# Patient Record
Sex: Male | Born: 1985 | Race: White | Hispanic: No | Marital: Single | Smoking: Current every day smoker
Health system: Southern US, Community
[De-identification: ages and names within clinical notes are randomized; demographics above are authoritative.]

## PROBLEM LIST (undated history)

## (undated) VITALS — BP 127/81 | HR 116 | Temp 98.4°F | Resp 16 | Ht 71.0 in | Wt 160.0 lb

## (undated) DIAGNOSIS — J45909 Unspecified asthma, uncomplicated: Secondary | ICD-10-CM

## (undated) DIAGNOSIS — F419 Anxiety disorder, unspecified: Secondary | ICD-10-CM

---

## 2016-06-23 ENCOUNTER — Inpatient Hospital Stay (HOSPITAL_COMMUNITY)
Admission: AD | Admit: 2016-06-23 | Discharge: 2016-07-02 | DRG: 885 | Disposition: A | Discharge status: Home / Self Care | Payer: No Typology Code available for payment source | Source: Intra-hospital | Attending: Emergency Medicine | Admitting: Emergency Medicine

## 2016-06-23 ENCOUNTER — Encounter (HOSPITAL_COMMUNITY): Payer: Self-pay | Admitting: *Deleted

## 2016-06-23 DIAGNOSIS — R109 Unspecified abdominal pain: Secondary | ICD-10-CM

## 2016-06-23 DIAGNOSIS — F333 Major depressive disorder, recurrent, severe with psychotic symptoms: Secondary | ICD-10-CM

## 2016-06-23 DIAGNOSIS — F17210 Nicotine dependence, cigarettes, uncomplicated: Secondary | ICD-10-CM | POA: Diagnosis present

## 2016-06-23 DIAGNOSIS — F323 Major depressive disorder, single episode, severe with psychotic features: Principal | ICD-10-CM | POA: Diagnosis present

## 2016-06-23 DIAGNOSIS — R4689 Other symptoms and signs involving appearance and behavior: Secondary | ICD-10-CM

## 2016-06-23 DIAGNOSIS — R45851 Suicidal ideations: Secondary | ICD-10-CM | POA: Diagnosis present

## 2016-06-23 DIAGNOSIS — N390 Urinary tract infection, site not specified: Secondary | ICD-10-CM

## 2016-06-23 DIAGNOSIS — R4589 Other symptoms and signs involving emotional state: Secondary | ICD-10-CM | POA: Diagnosis present

## 2016-06-23 HISTORY — DX: Unspecified asthma, uncomplicated: J45.909

## 2016-06-23 HISTORY — DX: Anxiety disorder, unspecified: F41.9

## 2016-06-23 MED ORDER — TRAZODONE HCL 50 MG PO TABS: 50.0000 mg | ORAL_TABLET | Freq: Every evening | ORAL | Status: DC | PRN

## 2016-06-23 MED ORDER — ALUM & MAG HYDROXIDE-SIMETH 200-200-20 MG/5ML PO SUSP: 30.0000 mL | ORAL | Status: DC | PRN

## 2016-06-23 MED ORDER — ACETAMINOPHEN 325 MG PO TABS
650.0000 mg | ORAL_TABLET | Freq: Four times a day (QID) | ORAL | Status: DC | PRN
  Administered 2016-06-26 – 2016-06-30 (×6): 650 mg via ORAL
  Filled 2016-06-23 (×6): qty 2

## 2016-06-23 MED ORDER — MAGNESIUM HYDROXIDE 400 MG/5ML PO SUSP: 30.0000 mL | Freq: Every day | ORAL | Status: DC | PRN

## 2016-06-23 MED ORDER — NICOTINE 21 MG/24HR TD PT24
21.0000 mg | MEDICATED_PATCH | Freq: Every day | TRANSDERMAL | Status: DC
  Administered 2016-06-24 – 2016-07-02 (×9): 21 mg via TRANSDERMAL
  Filled 2016-06-23 (×11): qty 1

## 2016-06-23 MED ORDER — HYDROXYZINE HCL 25 MG PO TABS
25.0000 mg | ORAL_TABLET | Freq: Four times a day (QID) | ORAL | Status: DC | PRN
  Administered 2016-06-24 – 2016-07-01 (×13): 25 mg via ORAL
  Filled 2016-06-23 (×13): qty 1
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
## 2016-06-24 DIAGNOSIS — F333 Major depressive disorder, recurrent, severe with psychotic symptoms: Secondary | ICD-10-CM

## 2016-06-24 DIAGNOSIS — F1920 Other psychoactive substance dependence, uncomplicated: Secondary | ICD-10-CM | POA: Diagnosis not present

## 2016-06-24 LAB — URINALYSIS W MICROSCOPIC (NOT AT ARMC)
BILIRUBIN URINE: NEGATIVE
GLUCOSE, UA: NEGATIVE mg/dL
Hgb urine dipstick: NEGATIVE
KETONES UR: NEGATIVE mg/dL
Nitrite: NEGATIVE
PH: 6 (ref 5.0–8.0)
PROTEIN: NEGATIVE mg/dL
Specific Gravity, Urine: 1.023 (ref 1.005–1.030)

## 2016-06-24 MED ORDER — MIRTAZAPINE 7.5 MG PO TABS
7.5000 mg | ORAL_TABLET | Freq: Every day | ORAL | Status: DC
  Administered 2016-06-24 – 2016-06-26 (×3): 7.5 mg via ORAL
  Filled 2016-06-24 (×6): qty 1

## 2016-06-24 MED ORDER — MOMETASONE FURO-FORMOTEROL FUM 100-5 MCG/ACT IN AERO
2.0000 | INHALATION_SPRAY | Freq: Two times a day (BID) | RESPIRATORY_TRACT | Status: DC
  Administered 2016-06-24 – 2016-07-01 (×15): 2 via RESPIRATORY_TRACT
  Filled 2016-06-24: qty 8.8

## 2016-06-24 MED ORDER — BACITRACIN-NEOMYCIN-POLYMYXIN OINTMENT TUBE
TOPICAL_OINTMENT | CUTANEOUS | Status: DC | PRN
  Filled 2016-06-24: qty 28
  Filled 2016-06-24 (×2): qty 15

## 2016-06-24 MED ORDER — FLUOXETINE HCL 20 MG PO CAPS
20.0000 mg | ORAL_CAPSULE | Freq: Every day | ORAL | Status: DC
  Administered 2016-06-24 – 2016-07-01 (×8): 20 mg via ORAL
  Filled 2016-06-24 (×12): qty 1

## 2016-06-24 MED ORDER — ALBUTEROL SULFATE HFA 108 (90 BASE) MCG/ACT IN AERS: 1.0000 | INHALATION_SPRAY | Freq: Four times a day (QID) | RESPIRATORY_TRACT | Status: DC | PRN
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
## 2016-06-24 NOTE — Plan of Care (Signed)
Problem: Self-Concept: Goal: Ability to disclose and discuss suicidal ideas will improve Outcome: Progressing Patient able to state he feels, "Depressed". Denies SI.
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
## 2016-06-24 NOTE — [facility] Group Notes (Signed)
[facility]   06/24/2016 9:53 AM  Participation Quality:  Appropriate/minimal   Mood/Affect:  Flat  Depression Rating:  0  Anxiety Rating:  0-pt appears to be minimizing this morning.   Thoughts of Suicide:  No Will you contract for safety?   NA  Current AVH:  No  Plan for Discharge/Comments:  Pt reports "things just got out of control. I have a lot of shit on my plate." Pt reports that he lives alone and quite drinking about a year ago. Pt reports some marijuana use and no other s/a although pt is positive for opiates upon admission. No current providers.   Transportation Means: family member possibly   Supports: some family/pt vague about family supports   Counselling psychologistmart, Conservation officer, natureHeather LCSW
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
## 2016-06-24 NOTE — B[facility] Suicide Risk Assessment (Signed)
B[facility] INPATIENT:  Family/Significant Other Suicide Prevention Education  Suicide Prevention Education:  Education Completed; Felipa FurnaceMichelle Vasquez (pt's girlfriend) [phone] has been identified by the patient as the family member/significant other with whom the patient will be residing, and identified as the person(s) who will aid the patient in the event of a mental health crisis (suicidal ideations/suicide attempt).  With written consent from the patient, the family member/significant other has been provided the following suicide prevention education, prior to the and/or following the discharge of the patient.  The suicide prevention education provided includes the following:  Suicide risk factors  Suicide prevention and interventions  National Suicide Hotline telephone number  [facility] assessment telephone number  [facility] Emergency Assistance 911  [facility] and/or Residential Mobile Crisis Unit telephone number  Request made of family/significant other to:  Remove weapons (e.g., guns, rifles, knives), all items previously/currently identified as safety concern.    Remove drugs/medications (over-the-counter, prescriptions, illicit drugs), all items previously/currently identified as a safety concern.  The family member/significant other verbalizes understanding of the suicide prevention education information provided.  The family member/significant other agrees to remove the items of safety concern listed above.  Smart, Jeshua Ransford LCSW 06/24/2016, 3:50 PM

## 2016-06-24 NOTE — Progress Notes (Signed)
Patient did attend the evening speaker AA meeting.  

## 2016-06-24 NOTE — Progress Notes (Signed)
Recreation Therapy Notes  Date: 07.14.2017 Time: 9:30am Location: 300 Hall Group Room   Group Topic: Stress Management  Goal Area(s) Addresses:  Patient will actively participate in stress management techniques presented during session.   Behavioral Response: Engaged, Attentive  Intervention: Stress management techniques  Activity : Deep Breathing and Progressive Body Relaxation. LRT provided education, instruction and demonstration on practice of Deep Breathing and Progressive Body Relaxation. Patient was asked to participate in technique introduced during session.   Education:  Stress Management, Discharge Planning.   Education Outcome: Acknowledges education  Clinical Observations/Feedback: Patient actively engaged in technique introduced, expressed no concerns and demonstrated ability to practice independently post d/c.   Marykay Lexenise L Darus Hershman, LRT/CTRS        Sapir Lavey L 06/24/2016 6:01 PM

## 2016-06-24 NOTE — [facility] Group Notes (Signed)
[facility] LCSW Group Therapy  06/24/2016 12:48 PM  Type of Therapy:  Group Therapy  Participation Level:  Active  Participation Quality:  Attentive  Affect:  Tearful  Cognitive:  Alert and Oriented   Insight:  Engaged  Engagement in Therapy:  Engaged  Modes of Intervention:  Confrontation, Discussion, Education, Exploration, Problem-solving, Rapport Building, Socialization and Support  Summary of Progress/Problems: Feelings around Relapse. Group members discussed the meaning of relapse and shared personal stories of relapse, how it affected them and others, and how they perceived themselves during this time. Group members were encouraged to identify triggers, warning signs and coping skills used when facing the possibility of relapse. Social supports were discussed and explored in detail. Post Acute Withdrawal Syndrome (handout provided) was introduced and examined. Pt's were encouraged to ask questions, talk about key points associated with PAWS, and process this information in terms of relapse prevention. Edward Stone was attentive and engaged during today's processing group. He shared his relapse story (regarding both MI and SA) that led to his involuntary admission. "I"m ready for help. I can't keep going on like this." He reports being sober one year and relapsing 3 days ago due to "lots of shit piling up." Edward Stone was tearful during group and stated that he had never opened up "like this" before.   Smart, Edward Ericsson LCSW 06/24/2016, 12:48 PM
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
## 2016-06-25 DIAGNOSIS — F4310 Post-traumatic stress disorder, unspecified: Secondary | ICD-10-CM

## 2016-06-25 DIAGNOSIS — F489 Nonpsychotic mental disorder, unspecified: Secondary | ICD-10-CM

## 2016-06-25 DIAGNOSIS — F332 Major depressive disorder, recurrent severe without psychotic features: Secondary | ICD-10-CM

## 2016-06-25 LAB — BASIC METABOLIC PANEL
ANION GAP: 5 (ref 5–15)
BUN: 9 mg/dL (ref 6–20)
CALCIUM: 9.4 mg/dL (ref 8.9–10.3)
CHLORIDE: 108 mmol/L (ref 101–111)
CO2: 29 mmol/L (ref 22–32)
Creatinine, Ser: 0.84 mg/dL (ref 0.61–1.24)
GFR calc non Af Amer: >60 mL/min (ref >60)
GLUCOSE: 98 mg/dL (ref 65–99)
POTASSIUM: 4.3 mmol/L (ref 3.5–5.1)
Sodium: 142 mmol/L (ref 135–145)

## 2016-06-25 LAB — CBC WITH DIFFERENTIAL/PLATELET
BASOS PCT: 0 %
Basophils Absolute: 0 10*3/uL (ref 0.0–0.1)
Eosinophils Absolute: 0.1 10*3/uL (ref 0.0–0.7)
Eosinophils Relative: 2 %
HEMATOCRIT: 43 % (ref 39.0–52.0)
HEMOGLOBIN: 14.3 g/dL (ref 13.0–17.0)
LYMPHS PCT: 38 %
Lymphs Abs: 3 10*3/uL (ref 0.7–4.0)
MCH: 31.6 pg (ref 26.0–34.0)
MCHC: 33.3 g/dL (ref 30.0–36.0)
MCV: 94.9 fL (ref 78.0–100.0)
MONO ABS: 0.7 10*3/uL (ref 0.1–1.0)
MONOS PCT: 9 %
NEUTROS ABS: 4 10*3/uL (ref 1.7–7.7)
NEUTROS PCT: 51 %
Platelets: 311 10*3/uL (ref 150–400)
RBC: 4.53 MIL/uL (ref 4.22–5.81)
RDW: 13.1 % (ref 11.5–15.5)
WBC: 7.9 10*3/uL (ref 4.0–10.5)

## 2016-06-25 LAB — TSH: TSH: 0.333 u[IU]/mL — ABNORMAL LOW (ref 0.350–4.500)

## 2016-06-25 NOTE — Progress Notes (Signed)
[facility] MD Progress Note  06/25/2016 10:13 AM Edward Stone  MRN:  [id number]   Subjective:  Patient reports " feeling better today than when I first came in but I am still very depressed."  Objective: Edward Stone is awake, alert and oriented X4.  Seen attending group session.  Denies suicidal or homicidal ideation. Denies auditory or visual hallucination and does not appear to be responding to internal stimuli. Patient interacts well with staff and others. Patient reports he is medication compliant without mediation side effects. States his depression 9/10. Patient states "I am feeling and little better." Reports good appetite and resting well. Support, encouragement and reassurance was provided.   Principal Problem: Suicidal behavior Diagnosis:   Patient Active Problem List   Diagnosis Date Noted  . Suicidal behavior [F48.9] 06/23/2016   Total Time spent with patient: 30 minutes  Past Psychiatric History: See Above  Past Medical History:  Past Medical History  Diagnosis Date  . Asthma   . Anxiety    History reviewed. No pertinent past surgical history. Family History: History reviewed. No pertinent family history. Family Psychiatric  History: See Above Social History:  History  Alcohol Use: Not on file     History  Drug Use  . Yes  . Special: Marijuana    Social History   Social History  . Marital Status: Single    Spouse Name: N/A  . Number of Children: N/A  . Years of Education: N/A   Social History Main Topics  . Smoking status: Current Every Day Smoker -- 1.00 packs/day    Types: Cigarettes  . Smokeless tobacco: Current User    Types: Chew  . Alcohol Use: None  . Drug Use: Yes    Special: Marijuana  . Sexual Activity: Not Currently   Other Topics Concern  . None   Social History Narrative  . None   Additional Social History:                         Sleep: Fair  Appetite:  Good  Current Medications: Current Facility-Administered Medications   Medication Dose Route Frequency Provider Last Rate Last Dose  . acetaminophen (TYLENOL) tablet 650 mg  650 mg Oral Q6H PRN Nanci Pina, FNP      . albuterol (PROVENTIL HFA;VENTOLIN HFA) 108 (90 Base) MCG/ACT inhaler 1 puff  1 puff Inhalation Q6H PRN Myer Peer Cobos, MD      . alum & mag hydroxide-simeth (MAALOX/MYLANTA) 200-200-20 MG/5ML suspension 30 mL  30 mL Oral Q4H PRN Nanci Pina, FNP      . FLUoxetine (PROZAC) capsule 20 mg  20 mg Oral Daily Jenne Campus, MD   20 mg at 06/25/16 0801  . hydrOXYzine (ATARAX/VISTARIL) tablet 25 mg  25 mg Oral Q6H PRN Nanci Pina, FNP   25 mg at 06/24/16 2242  . magnesium hydroxide (MILK OF MAGNESIA) suspension 30 mL  30 mL Oral Daily PRN Nanci Pina, FNP      . mirtazapine (REMERON) tablet 7.5 mg  7.5 mg Oral QHS Myer Peer Cobos, MD   7.5 mg at 06/24/16 2243  . mometasone-formoterol (DULERA) 100-5 MCG/ACT inhaler 2 puff  2 puff Inhalation BID Jenne Campus, MD   2 puff at 06/25/16 0802  . neomycin-bacitracin-polymyxin (NEOSPORIN) ointment   Topical PRN Encarnacion Slates, NP      . nicotine (NICODERM CQ - dosed in mg/24 hours) patch 21 mg  21  mg Transdermal Daily Nanci Pina, FNP   21 mg at 06/25/16 0938    Lab Results:  Results for orders placed or performed during the hospital encounter of 06/23/16 (from the past 48 hour(s))  Urinalysis with microscopic (not at [facility])     Status: Abnormal   Collection Time: 06/24/16  6:25 PM  Result Value Ref Range   Color, Urine YELLOW YELLOW   APPearance CLEAR CLEAR   Specific Gravity, Urine 1.023 1.005 - 1.030   pH 6.0 5.0 - 8.0   Glucose, UA NEGATIVE NEGATIVE mg/dL   Hgb urine dipstick NEGATIVE NEGATIVE   Bilirubin Urine NEGATIVE NEGATIVE   Ketones, ur NEGATIVE NEGATIVE mg/dL   Protein, ur NEGATIVE NEGATIVE mg/dL   Nitrite NEGATIVE NEGATIVE   Leukocytes, UA SMALL (A) NEGATIVE   WBC, UA 0-5 0 - 5 WBC/hpf   RBC / HPF 0-5 0 - 5 RBC/hpf   Bacteria, UA RARE (A) NONE SEEN   Squamous  Epithelial / LPF 0-5 (A) NONE SEEN   Crystals CA OXALATE CRYSTALS (A) NEGATIVE    Comment: Performed at [place] metabolic panel     Status: None   Collection Time: 06/25/16  6:20 AM  Result Value Ref Range   Sodium 142 135 - 145 mmol/L   Potassium 4.3 3.5 - 5.1 mmol/L   Chloride 108 101 - 111 mmol/L   CO2 29 22 - 32 mmol/L   Glucose, Bld 98 65 - 99 mg/dL   BUN 9 6 - 20 mg/dL   Creatinine, Ser 0.84 0.61 - 1.24 mg/dL   Calcium 9.4 8.9 - 10.3 mg/dL   GFR calc non Af Amer >60 >60 mL/min   GFR calc Af Amer >60 >60 mL/min    Comment: (NOTE) The eGFR has been calculated using the CKD EPI equation. This calculation has not been validated in all clinical situations. eGFR's persistently <60 mL/min signify possible Chronic Kidney Disease.    Anion gap 5 5 - 15    Comment: Performed at [facility]  CBC with Differential/Platelet     Status: None   Collection Time: 06/25/16  6:20 AM  Result Value Ref Range   WBC 7.9 4.0 - 10.5 K/uL   RBC 4.53 4.22 - 5.81 MIL/uL   Hemoglobin 14.3 13.0 - 17.0 g/dL   HCT 43.0 39.0 - 52.0 %   MCV 94.9 78.0 - 100.0 fL   MCH 31.6 26.0 - 34.0 pg   MCHC 33.3 30.0 - 36.0 g/dL   RDW 13.1 11.5 - 15.5 %   Platelets 311 150 - 400 K/uL   Neutrophils Relative % 51 %   Neutro Abs 4.0 1.7 - 7.7 K/uL   Lymphocytes Relative 38 %   Lymphs Abs 3.0 0.7 - 4.0 K/uL   Monocytes Relative 9 %   Monocytes Absolute 0.7 0.1 - 1.0 K/uL   Eosinophils Relative 2 %   Eosinophils Absolute 0.1 0.0 - 0.7 K/uL   Basophils Relative 0 %   Basophils Absolute 0.0 0.0 - 0.1 K/uL    Comment: Performed at [facility]  TSH     Status: Abnormal   Collection Time: 06/25/16  6:20 AM  Result Value Ref Range   TSH 0.333 (L) 0.350 - 4.500 uIU/mL    Comment: Performed at [facility]    Blood Alcohol level:  No results found for: [facility]  Metabolic Disorder Labs: No results found for: HGBA1C, MPG No results  found  for: PROLACTIN No results found for: CHOL, TRIG, HDL, CHOLHDL, VLDL, LDLCALC  Physical Findings: AIMS: Facial and Oral Movements Muscles of Facial Expression: None, normal Lips and Perioral Area: None, normal Jaw: None, normal Tongue: None, normal,Extremity Movements Upper (arms, wrists, hands, fingers): None, normal Lower (legs, knees, ankles, toes): None, normal, Trunk Movements Neck, shoulders, hips: None, normal, Overall Severity Severity of abnormal movements (highest score from questions above): None, normal Incapacitation due to abnormal movements: None, normal Patient's awareness of abnormal movements (rate only patient's report): No Awareness, Dental Status Current problems with teeth and/or dentures?: No Does patient usually wear dentures?: No  CIWA:    COWS:     Musculoskeletal: Strength & Muscle Tone: within normal limits Gait & Station: normal Patient leans: N/A  Psychiatric Specialty Exam: Physical Exam  Nursing note and vitals reviewed. Constitutional: He is oriented to person, place, and time. He appears well-developed.  HENT:  Head: Normocephalic.  Cardiovascular: Normal rate.   Musculoskeletal: Normal range of motion.  Neurological: He is alert and oriented to person, place, and time.  Psychiatric: He has a normal mood and affect. His behavior is normal.    Review of Systems  Psychiatric/Behavioral: Positive for depression. Negative for suicidal ideas, hallucinations and substance abuse. The patient is nervous/anxious.   All other systems reviewed and are negative.   Blood pressure 134/86, pulse 68, temperature 97.5 F (36.4 C), temperature source Oral, resp. rate 16, height 5' 7.72" (1.72 m), weight 60.782 kg (134 lb).Body mass index is 20.55 kg/(m^2).  General Appearance: Casual and Fairly Groomed  Eye Contact:  Good  Speech:  Clear and Coherent  Volume:  Normal  Mood:  Depressed and Irritable  Affect:  Flat  Thought Process:  Coherent   Orientation:  Full (Time, Place, and Person)  Thought Content:  Hallucinations: None  Suicidal Thoughts:  No  Homicidal Thoughts:  No  Memory:  Immediate;   Fair Recent;   Fair Remote;   Fair  Judgement:  Fair  Insight:  Good  Psychomotor Activity:  Restlessness  Concentration:  Concentration: Fair  Recall:  [employer] of Knowledge:  Fair  Language:  Fair  Akathisia:  No  Handed:  Right  AIMS (if indicated):     Assets:  Communication Skills Desire for Improvement Resilience Social Support  ADL's:  Intact  Cognition:  WNL  Sleep:  Number of Hours: 5.25    I agree with current treatment plan on 06/25/2016, Patient seen face-to-face for psychiatric evaluation follow-up, chart reviewed. Reviewed the information documented and agree with the treatment plan.  Treatment Plan Summary: Daily contact with patient to assess and evaluate symptoms and progress in treatment and Medication management   Medications: We discussed medication options, agrees to antidepressant, states price is a consideration as cannot afford medications easily We agreed to start [facility] for depression and augment with REMERON 7.5 mgrs QHS for insomnia, depression  Will consider adding an antipsychotic , but at this time prefers to start with the above two medications  Will continue to monitor vitals ,medication compliance and treatment side effects while patient is here.  Reviewed Labs: THS 0.333(L). Orders placed for free T-4  ,BAL -0, UDS - no collected CSW will start working on disposition.  Patient to participate in therapeutic milieu  Derrill Center, NP 06/25/2016, 10:13 AM Agree with NP Progress Note, as above

## 2016-06-25 NOTE — Progress Notes (Signed)
D) Pt. Reports beginning to feel better.  Superficially pleasant during interactions.  No c/o physical discomfort. A) Medication education provided.  Pt. Compliant.  Pt. Minimizing issues.   R) Pt. Receptive and contracts for safety.
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
## 2016-06-25 NOTE — [facility] Group Notes (Signed)
  [facility] LCSW Group Therapy Note  06/25/2016 at 10:00 AM  Type of Therapy and Topic:  Group Therapy: Avoiding Self-Sabotaging and Enabling Behaviors  Participation Level:  Active  Participation Quality:  Monopolizing  Affect:  Appropriate  Cognitive:  Alert and Oriented  Insight:  Developing/Improving  Engagement in Therapy:  Engaged   Therapeutic models used: Cognitive Behavioral Therapy,  Person-Centered Therapy and Motivational Interviewing  Modes of Intervention:  Discussion, Exploration, Orientation, Rapport Building, Socialization and Support   Summary of Progress/Problems:  The main focus of today's process group was for the patient to identify ways in which they have in the past sabotaged their own recovery. Motivational Interviewing was utilized to identify motivation they may have for wanting to change. Patient shared easily and frequently and at times had tendency to monopolize. Patient was easily redirected and willing to refocus to avoid tangents.    Carney Bernatherine C Denzal Meir, LCSW

## 2016-06-25 NOTE — Progress Notes (Signed)
D.  Pt pleasant on approach, denies complaints at this time.  Positive for evening AA group but left due to peer that "got on my nerves".  Pt observed interacting appropriately with peers on the unit.  Pt denies SI/HI/hallucinations at this time.  A.  Support and encouragement offered, medication given as ordered.  R.  Pt remains safe on the unit, will continue to monitor.

## 2016-06-25 NOTE — Progress Notes (Signed)
D: Pt complained moderate anxiety and moderate depression; states, "they are both still pretty high but I must tell you, they are better than they were when I came in." Pt also rate L. shoulder pain at 4. Pt however, denied SI, HI or AVH. Pt was observed having appropriate conversation with peers. Pt remained calm and cooperative. A: Medications offered as prescribed.  Support, encouragement, and safe environment provided.  15-minute safety checks continue. R: Pt was med compliant.  Pt attended group. Safety checks continue.

## 2016-06-26 MED ORDER — CLONIDINE HCL ER 0.1 MG PO TB12: 0.1000 mg | ORAL_TABLET | Freq: Three times a day (TID) | ORAL | Status: DC | PRN

## 2016-06-26 MED ORDER — LISINOPRIL 10 MG PO TABS
10.0000 mg | ORAL_TABLET | Freq: Every day | ORAL | Status: DC
  Administered 2016-06-26 – 2016-07-01 (×6): 10 mg via ORAL
  Filled 2016-06-26 (×7): qty 1
  Filled 2016-06-26: qty 2
  Filled 2016-06-26 (×2): qty 1

## 2016-06-26 MED ORDER — CLONIDINE HCL ER 0.1 MG PO TB12
0.1000 mg | ORAL_TABLET | Freq: Every day | ORAL | Status: DC
Start: 2016-06-26 — End: 2016-06-26

## 2016-06-26 MED ORDER — CLONIDINE HCL 0.1 MG PO TABS
0.1000 mg | ORAL_TABLET | Freq: Three times a day (TID) | ORAL | Status: DC | PRN
Start: 2016-06-26 — End: 2016-07-01

## 2016-06-26 NOTE — Progress Notes (Signed)
[facility] MD Progress Note  06/26/2016 3:43 PM Jameon Deller [facility]  MRN:  [id number]   Subjective:  Patient reports " I think I am Bipolar because I have a lot of family members that have that."    Objective: Eliyas Suddreth [facility] is awake, alert and oriented X4.  Seen attending group session.  Patient reports auditory hallucination of his deceased children. patient reports" I am unable to tell if I am reliving the moments with my kids. But I do hear their voices when I am alone". Patient denies command hallucinations. Denies suicidal or homicidal ideation. Denies a visual hallucination and does not appear to be responding to internal stimuli. Patient interacts well with staff and others. Patient reports he is medication compliant without mediation side effects. States his depression 9/10. Reports good appetite and resting well. Support, encouragement and reassurance was provided.   Patient reports past hx of thyroid surgery.   Principal Problem: Suicidal behavior Diagnosis:   Patient Active Problem List   Diagnosis Date Noted  . Suicidal behavior [F48.9] 06/23/2016   Total Time spent with patient: 30 minutes  Past Psychiatric History: See Above  Past Medical History:  Past Medical History  Diagnosis Date  . Asthma   . Anxiety    History reviewed. No pertinent past surgical history. Family History: History reviewed. No pertinent family history. Family Psychiatric  History: See Above Social History:  History  Alcohol Use: Not on file     History  Drug Use  . Yes  . Special: Marijuana    Social History   Social History  . Marital Status: Single    Spouse Name: N/A  . Number of Children: N/A  . Years of Education: N/A   Social History Main Topics  . Smoking status: Current Every Day Smoker -- 1.00 packs/day    Types: Cigarettes  . Smokeless tobacco: Current User    Types: Chew  . Alcohol Use: None  . Drug Use: Yes    Special: Marijuana  . Sexual Activity: Not Currently   Other Topics  Concern  . None   Social History Narrative  . None   Additional Social History:                         Sleep: Fair  Appetite:  Good  Current Medications: Current Facility-Administered Medications  Medication Dose Route Frequency Provider Last Rate Last Dose  . acetaminophen (TYLENOL) tablet 650 mg  650 mg Oral Q6H PRN Nanci Pina, FNP   650 mg at 06/26/16 1428  . albuterol (PROVENTIL HFA;VENTOLIN HFA) 108 (90 Base) MCG/ACT inhaler 1 puff  1 puff Inhalation Q6H PRN Myer Peer Aveon Colquhoun, MD      . alum & mag hydroxide-simeth (MAALOX/MYLANTA) 200-200-20 MG/5ML suspension 30 mL  30 mL Oral Q4H PRN Nanci Pina, FNP      . FLUoxetine (PROZAC) capsule 20 mg  20 mg Oral Daily Jenne Campus, MD   20 mg at 06/26/16 0749  . hydrOXYzine (ATARAX/VISTARIL) tablet 25 mg  25 mg Oral Q6H PRN Nanci Pina, FNP   25 mg at 06/26/16 1120  . lisinopril (PRINIVIL,ZESTRIL) tablet 10 mg  10 mg Oral Daily Derrill Center, NP   10 mg at 06/26/16 0840  . magnesium hydroxide (MILK OF MAGNESIA) suspension 30 mL  30 mL Oral Daily PRN Nanci Pina, FNP      . mirtazapine (REMERON) tablet 7.5 mg  7.5 mg Oral QHS Felicita Gage  A Marnita Poirier, MD   7.5 mg at 06/25/16 2220  . mometasone-formoterol (DULERA) 100-5 MCG/ACT inhaler 2 puff  2 puff Inhalation BID Jenne Campus, MD   2 puff at 06/26/16 0751  . neomycin-bacitracin-polymyxin (NEOSPORIN) ointment   Topical PRN Encarnacion Slates, NP      . nicotine (NICODERM CQ - dosed in mg/24 hours) patch 21 mg  21 mg Transdermal Daily Nanci Pina, FNP   21 mg at 06/26/16 7793    Lab Results:  Results for orders placed or performed during the hospital encounter of 06/23/16 (from the past 48 hour(s))  Urinalysis with microscopic (not at [facility])     Status: Abnormal   Collection Time: 06/24/16  6:25 PM  Result Value Ref Range   Color, Urine YELLOW YELLOW   APPearance CLEAR CLEAR   Specific Gravity, Urine 1.023 1.005 - 1.030   pH 6.0 5.0 - 8.0   Glucose, UA  NEGATIVE NEGATIVE mg/dL   Hgb urine dipstick NEGATIVE NEGATIVE   Bilirubin Urine NEGATIVE NEGATIVE   Ketones, ur NEGATIVE NEGATIVE mg/dL   Protein, ur NEGATIVE NEGATIVE mg/dL   Nitrite NEGATIVE NEGATIVE   Leukocytes, UA SMALL (A) NEGATIVE   WBC, UA 0-5 0 - 5 WBC/hpf   RBC / HPF 0-5 0 - 5 RBC/hpf   Bacteria, UA RARE (A) NONE SEEN   Squamous Epithelial / LPF 0-5 (A) NONE SEEN   Crystals CA OXALATE CRYSTALS (A) NEGATIVE    Comment: Performed at [facility] metabolic panel     Status: None   Collection Time: 06/25/16  6:20 AM  Result Value Ref Range   Sodium 142 135 - 145 mmol/L   Potassium 4.3 3.5 - 5.1 mmol/L   Chloride 108 101 - 111 mmol/L   CO2 29 22 - 32 mmol/L   Glucose, Bld 98 65 - 99 mg/dL   BUN 9 6 - 20 mg/dL   Creatinine, Ser 0.84 0.61 - 1.24 mg/dL   Calcium 9.4 8.9 - 10.3 mg/dL   GFR calc non Af Amer >60 >60 mL/min   GFR calc Af Amer >60 >60 mL/min    Comment: (NOTE) The eGFR has been calculated using the CKD EPI equation. This calculation has not been validated in all clinical situations. eGFR's persistently <60 mL/min signify possible Chronic Kidney Disease.    Anion gap 5 5 - 15    Comment: Performed at [facility]  CBC with Differential/Platelet     Status: None   Collection Time: 06/25/16  6:20 AM  Result Value Ref Range   WBC 7.9 4.0 - 10.5 K/uL   RBC 4.53 4.22 - 5.81 MIL/uL   Hemoglobin 14.3 13.0 - 17.0 g/dL   HCT 43.0 39.0 - 52.0 %   MCV 94.9 78.0 - 100.0 fL   MCH 31.6 26.0 - 34.0 pg   MCHC 33.3 30.0 - 36.0 g/dL   RDW 13.1 11.5 - 15.5 %   Platelets 311 150 - 400 K/uL   Neutrophils Relative % 51 %   Neutro Abs 4.0 1.7 - 7.7 K/uL   Lymphocytes Relative 38 %   Lymphs Abs 3.0 0.7 - 4.0 K/uL   Monocytes Relative 9 %   Monocytes Absolute 0.7 0.1 - 1.0 K/uL   Eosinophils Relative 2 %   Eosinophils Absolute 0.1 0.0 - 0.7 K/uL   Basophils Relative 0 %   Basophils Absolute 0.0 0.0 - 0.1 K/uL    Comment:  Performed at [facility]  TSH     Status: Abnormal   Collection Time: 06/25/16  6:20 AM  Result Value Ref Range   TSH 0.333 (L) 0.350 - 4.500 uIU/mL    Comment: Performed at [facility]    Blood Alcohol level:  No results found for: [facility]  Metabolic Disorder Labs: No results found for: HGBA1C, MPG No results found for: PROLACTIN No results found for: CHOL, TRIG, HDL, CHOLHDL, VLDL, LDLCALC  Physical Findings: AIMS: Facial and Oral Movements Muscles of Facial Expression: None, normal Lips and Perioral Area: None, normal Jaw: None, normal Tongue: None, normal,Extremity Movements Upper (arms, wrists, hands, fingers): None, normal Lower (legs, knees, ankles, toes): None, normal, Trunk Movements Neck, shoulders, hips: None, normal, Overall Severity Severity of abnormal movements (highest score from questions above): None, normal Incapacitation due to abnormal movements: None, normal Patient's awareness of abnormal movements (rate only patient's report): No Awareness, Dental Status Current problems with teeth and/or dentures?: No Does patient usually wear dentures?: No  CIWA:    COWS:     Musculoskeletal: Strength & Muscle Tone: within normal limits Gait & Station: normal Patient leans: N/A  Psychiatric Specialty Exam: Physical Exam  Nursing note and vitals reviewed. Constitutional: He is oriented to person, place, and time. He appears well-developed.  HENT:  Head: Normocephalic.  Cardiovascular: Normal rate.   Musculoskeletal: Normal range of motion.  Neurological: He is alert and oriented to person, place, and time.  Psychiatric: He has a normal mood and affect. His behavior is normal.    Review of Systems  Psychiatric/Behavioral: Positive for depression. Negative for suicidal ideas, hallucinations and substance abuse. The patient is nervous/anxious.   All other systems reviewed and are negative.   Blood pressure 128/80, pulse 68,  temperature 97.9 F (36.6 C), temperature source Oral, resp. rate 16, height 5' 7.72" (1.72 m), weight 60.782 kg (134 lb).Body mass index is 20.55 kg/(m^2).  General Appearance: Casual and Fairly Groomed  Eye Contact:  Good  Speech:  Clear and Coherent  Volume:  Normal  Mood:  Depressed and Irritable  Affect:  Flat  Thought Process:  Coherent  Orientation:  Full (Time, Place, and Person)  Thought Content:  Hallucinations: Auditory  Suicidal Thoughts:  No  Homicidal Thoughts:  No  Memory:  Immediate;   Fair Recent;   Fair Remote;   Fair  Judgement:  Fair  Insight:  Good  Psychomotor Activity:  Restlessness  Concentration:  Concentration: Fair  Recall:  [employer] of Knowledge:  Fair  Language:  Fair  Akathisia:  No  Handed:  Right  AIMS (if indicated):     Assets:  Communication Skills Desire for Improvement Resilience Social Support  ADL's:  Intact  Cognition:  WNL  Sleep:  Number of Hours: 5.25    I agree with current treatment plan on 06/26/2016, Patient seen face-to-face for psychiatric evaluation follow-up, chart reviewed. Reviewed the information documented and agree with the treatment plan.  Treatment Plan Summary: Daily contact with patient to assess and evaluate symptoms and progress in treatment and Medication management   Medications:  We discussed medication options, agrees to antidepressant, states price is a consideration as cannot afford medications easily We agreed to start [facility] for depression and augment with REMERON 7.5 mgrs QHS for insomnia, depression  Will consider adding an antipsychotic , but at this time prefers to start with the above two medications  Will continue to monitor vitals ,medication compliance and treatment side effects while patient is  here.  Start Lisinopril 10 mg PO BID for HTN Start clonidine 0.104m PO TID for elevated B/P Reviewed Labs: TSH 0.333(L). Orders placed for free T-4, prolactin, A1c and Lipid  ,BAL -0, UDS - no  collected. Consider starting on antipsychotic- pending lab results. Due to auditory hallucinations CSW will start working on disposition.  Patient to participate in therapeutic milieu  TDerrill Center NP 06/26/2016, 3:43 PM Agree with NP progress note as above

## 2016-06-26 NOTE — Progress Notes (Signed)
D: Patient is pleasant upon approach.  Concerned about his elevated blood pressure.  States, "I'm not getting the BP medication I was taking at home."  Patient rates his depression as an 8; hopelessness and anxiety as 7.  He continues to report poor sleep.  His goal today is to "get through today."  He denies any self harm thoughts or behaviors.  He denies HI/AVH.   A: Continue to monitor medication management and MD orders.  Safety checks continued every 15 minutes per protocol.  Offer support and encouragement as needed.  Lisinopril 10 mg ordered for elevated BP. R: Patient is receptive to staff; his behavior is appropriate.

## 2016-06-26 NOTE — [facility] Group Notes (Signed)
[facility] Group Notes:  (Nursing/MHT/Case Management/Adjunct)  Date:  06/26/2016  Time:  0900 am  Type of Therapy:  Psychoeducational Skills  Participation Level:  Did Not Attend  Patient invited; declined to attend.  Edward Stone, Edward Stone 06/26/2016, 10:03 AM

## 2016-06-26 NOTE — [facility] Group Notes (Signed)
[facility] LCSW Group Therapy  06/26/2016 10:10 to 11:05 AM  Type of Therapy:  Group Therapy  Participation Level:  Active  Participation Quality:  Attentive and Sharing  Affect:  Appropriate  Cognitive:  Appropriate  Insight:  Developing/Improving  Engagement in Therapy:  Engaged  Modes of Intervention:  Activity, Discussion, Exploration, Rapport Building, Socialization and Support  Summary of Progress/Problems: Topic for today was thoughts and feelings regarding discharge. We discussed fears of upcoming changes including judegements, expectations and stigma of mental health issues. We then discussed supports: what constitutes a supportive framework. As patients processed their anxiety about discharge and described healthy supports patient shared importance of shared experience. Patient was encouraging to others.  Patient chose a visual to represent decompensation as a grave and improvement as a path that is actually doable  Carney Bernatherine C Harrill, LCSW

## 2016-06-26 NOTE — Progress Notes (Signed)
Patient did attend the evening speaker AA meeting.  

## 2016-06-27 DIAGNOSIS — F333 Major depressive disorder, recurrent, severe with psychotic symptoms: Secondary | ICD-10-CM | POA: Diagnosis not present

## 2016-06-27 LAB — T4, FREE: Free T4: 0.76 ng/dL (ref 0.61–1.12)

## 2016-06-27 MED ORDER — TOPIRAMATE 25 MG PO TABS
25.0000 mg | ORAL_TABLET | Freq: Every day | ORAL | Status: DC
Start: 2016-06-27 — End: 2016-07-01
  Administered 2016-06-27 – 2016-07-01 (×5): 25 mg via ORAL
  Filled 2016-06-27 (×9): qty 1

## 2016-06-27 MED ORDER — TRAZODONE HCL 50 MG PO TABS
50.0000 mg | ORAL_TABLET | Freq: Every evening | ORAL | Status: DC | PRN
  Administered 2016-06-27 – 2016-06-28 (×2): 50 mg via ORAL
  Filled 2016-06-27 (×9): qty 1

## 2016-06-27 MED ORDER — GABAPENTIN 100 MG PO CAPS
100.0000 mg | ORAL_CAPSULE | Freq: Three times a day (TID) | ORAL | Status: DC
  Administered 2016-06-27 – 2016-06-28 (×4): 100 mg via ORAL
  Filled 2016-06-27 (×9): qty 1
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
## 2016-06-27 NOTE — [facility] Group Notes (Signed)
[facility] LCSW Group Therapy  06/27/2016 4:27 PM  Type of Therapy:  Group Therapy  Participation Level:  Active  Participation Quality:  Attentive  Affect:  Appropriate  Cognitive:  Alert and Oriented  Insight:  Improving  Engagement in Therapy:  Improving  Modes of Intervention:  Discussion, Education, Exploration, Limit-setting, Problem-solving, Rapport Building, Socialization and Support  Summary of Progress/Problems: Today's Topic: Overcoming Obstacles. Patients identified one short term goal and potential obstacles in reaching this goal. Patients processed barriers involved in overcoming these obstacles. Patients identified steps necessary for overcoming these obstacles and explored motivation (internal and external) for facing these difficulties head on. Edward Stone was attentive and engaged during today's processing groups. Edward Stone shared that his biggest goal is to "work on my mood and my ptsd. I think I'm bipolar." He shared that he broke off his relationship with his fiance and is struggling with this loss but feels safe in the hospital setting.   Smart, Adi Seales LCSW 06/27/2016, 4:27 PM

## 2016-06-27 NOTE — Progress Notes (Signed)
Recreation Therapy Notes  Date: 07.17.2017 Time: 9:30am Location: [address] Group Room   Group Topic: Stress Management  Goal Area(s) Addresses:  Patient will actively participate in stress management techniques presented during session.   Behavioral Response: Engaged, Attentive   Intervention: Stress management techniques  Activity :  Deep Breathing, Mindfulness and Mindful Breathing. LRT provided education, instruction and demonstration on practice of Deep Breathing, Mindfulness and Mindful Breathing. Patient was asked to participate in technique introduced during session.   Education:  Stress Management, Discharge Planning.   Education Outcome: Acknowledges education  Clinical Observations/Feedback: Patient actively engaged in technique introduced, expressed no concerns and demonstrated ability to practice independently post d/c. Patient requested literature on stress management techniques covered during admission, LRT provided.   Marykay Lexenise L Lorrayne Ismael, LRT/CTRS        Willard Madrigal L 06/27/2016 1:31 PM

## 2016-06-27 NOTE — Progress Notes (Signed)
Pt stated he was concerned that he was supposed to get something (neurotin) for pain. Pt was encouraged to speak with the Doctor in the morning. Pt stated he would and had no more issues at this time.

## 2016-06-27 NOTE — [facility] Group Notes (Signed)
[facility]   06/27/2016 1:26 PM  Participation Quality: Invited. DID NOT ATTEND. Pt chose to rest in bed.    Smart, Jayven Naill LCSW
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
## 2016-06-28 ENCOUNTER — Encounter (HOSPITAL_COMMUNITY): Payer: Self-pay | Admitting: Mental Health

## 2016-06-28 DIAGNOSIS — F333 Major depressive disorder, recurrent, severe with psychotic symptoms: Secondary | ICD-10-CM | POA: Diagnosis not present

## 2016-06-28 DIAGNOSIS — F489 Nonpsychotic mental disorder, unspecified: Secondary | ICD-10-CM | POA: Diagnosis not present

## 2016-06-28 LAB — HEMOGLOBIN A1C
HEMOGLOBIN A1C: 5.5 % (ref 4.8–5.6)
MEAN PLASMA GLUCOSE: 111 mg/dL

## 2016-06-28 LAB — LIPID PANEL
Cholesterol: 140 mg/dL (ref 0–200)
HDL: 65 mg/dL (ref >40)
LDL CALC: 60 mg/dL (ref 0–99)
Total CHOL/HDL Ratio: 2.2 RATIO
Triglycerides: 73 mg/dL (ref <150)
VLDL: 15 mg/dL (ref 0–40)

## 2016-06-28 MED ORDER — ARIPIPRAZOLE 2 MG PO TABS
2.0000 mg | ORAL_TABLET | Freq: Every day | ORAL | Status: DC
  Administered 2016-06-28 – 2016-06-30 (×3): 2 mg via ORAL
  Filled 2016-06-28 (×7): qty 1

## 2016-06-28 MED ORDER — PRAZOSIN HCL 1 MG PO CAPS
1.0000 mg | ORAL_CAPSULE | Freq: Every day | ORAL | Status: DC
  Administered 2016-06-28 – 2016-06-30 (×3): 1 mg via ORAL
  Filled 2016-06-28 (×7): qty 1

## 2016-06-28 MED ORDER — GABAPENTIN 100 MG PO CAPS
200.0000 mg | ORAL_CAPSULE | Freq: Three times a day (TID) | ORAL | Status: DC
  Administered 2016-06-29 – 2016-06-30 (×5): 200 mg via ORAL
  Filled 2016-06-28 (×14): qty 2

## 2016-06-28 NOTE — Progress Notes (Signed)
Patient rated his day a 5. He stated that he had lots of anxiety. Goal is to maintain stability.

## 2016-06-28 NOTE — [facility] Group Notes (Signed)
[facility] LCSW Group Therapy  06/28/2016 1:54 PM  Type of Therapy:  Group Therapy  Participation Level:  Active  Participation Quality:  Attentive  Affect:  Appropriate  Cognitive:  Alert and Oriented  Insight:  Improving  Engagement in Therapy:  Engaged  Modes of Intervention:  Discussion, Education, Exploration, Problem-solving, Rapport Building, Socialization and Support  Summary of Progress/Problems: [facility] Speaker came to talk about his personal journey with substance abuse and addiction. The pt processed ways by which to relate to the speaker. [facility] speaker provided handouts and educational information pertaining to groups and services [facility].   Smart, Kla Bily LCSW 06/28/2016, 1:54 PM

## 2016-06-28 NOTE — Progress Notes (Signed)
D: Patient complains of anxiety today.  He continues to report depressive symptoms.  Patient is sleeping and eating well.  He rates his depression as a 7; hopelessness as a 4; anxiety as a 6.  He denies any withdrawal symptoms.  Patient's goal today is "getting well."  He denies any thoughts of self harm or behaviors.  He denies HI/AVH.  He has been attending groups and participating in his treatment.  He is observed in the day room interacting with his peers. A: Continue to monitor medication management and MD orders.  Safety checks completed every 15 minutes per protocol.  Offer support and encouragement as needed. R: Patient is receptive to staff; his behavior is appropriate.

## 2016-06-28 NOTE — Progress Notes (Signed)
Recreation Therapy Notes  Animal-Assisted Activity (AAA) Program Checklist/Progress Notes Patient Eligibility Criteria Checklist & Daily Group note for Rec TxIntervention  Date: 07.18.2017 Time: 2:45pm [address]    AAA/T Program Assumption of Risk Form signed by Patient/ or Parent Legal Guardian Yes  Patient is free of allergies or sever asthma Yes  Patient reports no fear of animals Yes  Patient reports no history of cruelty to animals Yes  Patient understands his/her participation is voluntary Yes  Behavioral Response: Did not attend.   Marykay Lexenise L Sharief Wainwright, LRT/CTRS         Larysa Pall L 06/28/2016 3:08 PM
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
## 2016-06-28 NOTE — [facility] Group Notes (Signed)
The focus of this group is to educate the patient on the purpose and policies of crisis stabilization and provide a format to answer questions about their admission.  The group details unit policies and expectations of patients while admitted.  Patient did not attend 0900 nurse education orientation group this morning.   Patient was sleeping.   
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
## 2016-06-29 ENCOUNTER — Emergency Department (HOSPITAL_COMMUNITY)
Admission: EM | Admit: 2016-06-29 | Discharge: 2016-06-29 | Disposition: A | Discharge status: Home / Self Care | Payer: Self-pay

## 2016-06-29 ENCOUNTER — Encounter (HOSPITAL_COMMUNITY): Payer: Self-pay

## 2016-06-29 ENCOUNTER — Inpatient Hospital Stay (HOSPITAL_COMMUNITY): Payer: No Typology Code available for payment source

## 2016-06-29 DIAGNOSIS — F333 Major depressive disorder, recurrent, severe with psychotic symptoms: Secondary | ICD-10-CM | POA: Diagnosis not present

## 2016-06-29 LAB — PROLACTIN: Prolactin: 16.6 ng/mL — ABNORMAL HIGH (ref 4.0–15.2)

## 2016-06-29 MED ORDER — SODIUM CHLORIDE 0.9 % IV SOLN
INTRAVENOUS | Status: DC
  Administered 2016-06-29: 1000 mL via INTRAVENOUS
  Filled 2016-06-29: qty 1000

## 2016-06-29 MED ORDER — KETOROLAC TROMETHAMINE 30 MG/ML IJ SOLN
30.0000 mg | Freq: Once | INTRAMUSCULAR | Status: AC
  Administered 2016-06-29: 30 mg via INTRAVENOUS
  Filled 2016-06-29: qty 1

## 2016-06-29 MED ORDER — LORATADINE 10 MG PO TABS
10.0000 mg | ORAL_TABLET | Freq: Every day | ORAL | Status: DC
  Administered 2016-06-29 – 2016-07-01 (×3): 10 mg via ORAL
  Filled 2016-06-29 (×6): qty 1

## 2016-06-29 MED ORDER — HYDROMORPHONE HCL 1 MG/ML IJ SOLN
1.0000 mg | Freq: Once | INTRAMUSCULAR | Status: AC
  Administered 2016-06-29: 1 mg via INTRAVENOUS
  Filled 2016-06-29: qty 1

## 2016-06-29 MED ORDER — TRAZODONE HCL 100 MG PO TABS
100.0000 mg | ORAL_TABLET | Freq: Every day | ORAL | Status: DC
  Administered 2016-06-30 (×2): 100 mg via ORAL
  Filled 2016-06-29 (×4): qty 1

## 2016-06-29 MED ORDER — LORATADINE 10 MG PO TABS
ORAL_TABLET | ORAL | Status: AC
  Administered 2016-06-29: 10 mg
  Filled 2016-06-29: qty 1

## 2016-06-29 MED ORDER — KETOROLAC TROMETHAMINE 30 MG/ML IJ SOLN
30.0000 mg | Freq: Once | INTRAMUSCULAR | Status: AC
  Administered 2016-06-29: 30 mg via INTRAMUSCULAR
  Filled 2016-06-29 (×2): qty 1
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
## 2016-06-29 NOTE — ED Notes (Signed)
Bed: WTR8 Expected date:  Expected time:  Means of arrival:  [facility]
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
## 2016-06-29 NOTE — Progress Notes (Signed)
[facility] per pellam with sitter, [facility]

## 2016-06-29 NOTE — [facility] Group Notes (Signed)
[facility] LCSW Group Therapy  06/29/2016 3:55 PM  Type of Therapy:  Group Therapy  Participation Level:  Active  Participation Quality:  Drowsy  Affect:  Depressed  Cognitive:  Oriented  Insight:  Improving  Engagement in Therapy:  Improving  Modes of Intervention:  Discussion, Education, Exploration, Problem-solving, Rapport Building, Socialization and Support  Summary of Progress/Problems: Today's Topic: Overcoming Obstacles. Patients identified one short term goal and potential obstacles in reaching this goal. Patients processed barriers involved in overcoming these obstacles. Patients identified steps necessary for overcoming these obstacles and explored motivation (internal and external) for facing these difficulties head on. Edward Stone was attentive and engaged during today's processing group. He stated that his biggest obstacle is "getting my anxiety and depression under control. I still think I'm bipolar and need to be medicated for that." Edward Muttersoy shared that he wants to meet with the doctor to gain clarity about his diagnosis and treatment plan.   Smart, Ian Castagna LCSW 06/29/2016, 3:55 PM

## 2016-06-29 NOTE — Progress Notes (Addendum)
Patient ID: Edward Stone, male   DOB: Dec 30, 1985, 30 y.o.   MRN: [id number] [facility] MD Progress Note  06/29/2016 1:59 PM Edward Stone  MRN:  [id number]   Subjective:  Patient reports ongoing anxiety, and episodes of increased anxiety, panic, although states that he can sometimes curtail symptoms by deep breathing and " trying to relax".   Denies medication side effects. Feels medications are helping partially.  Objective:  I have discussed case with treatment team and have met with patient . Reports he has been feeling partially better, and states nightmares have subsided on Minipress, but continues to feel depressed, and to have anxiety symptoms as above. Reports social anxiety symptoms, such as feeling self conscious in groups, and that " all eyes are on me ".  Denies any cravings. States he has had some fleeting passive SI, but states " it's getting better, I really don't want to die " Visible on the unit, going to some groups .    Principal Problem: Severe recurrent major depressive disorder with psychotic features ([address]) Diagnosis:   Patient Active Problem List   Diagnosis Date Noted  . Severe recurrent major depressive disorder with psychotic features ([place]) [F33.3] 06/28/2016  . Suicidal behavior [F48.9] 06/23/2016   Total Time spent with patient: 25 minutes   Past Psychiatric History: See Above  Past Medical History:  Past Medical History  Diagnosis Date  . Asthma   . Anxiety    History reviewed. No pertinent past surgical history. Family History: History reviewed. No pertinent family history. Family Psychiatric  History: See Above Social History:  History  Alcohol Use  . Yes     History  Drug Use  . Yes  . Special: Marijuana    Social History   Social History  . Marital Status: Single    Spouse Name: N/A  . Number of Children: N/A  . Years of Education: N/A   Social History Main Topics  . Smoking status: Current Every Day Smoker -- 1.00 packs/day    Types:  Cigarettes  . Smokeless tobacco: Current User    Types: Chew  . Alcohol Use: Yes  . Drug Use: Yes    Special: Marijuana  . Sexual Activity: Not Currently   Other Topics Concern  . None   Social History Narrative   Additional Social History:   Sleep: Fair  Appetite:  Good  Current Medications: Current Facility-Administered Medications  Medication Dose Route Frequency Provider Last Rate Last Dose  . loratadine (CLARITIN) 10 MG tablet           . acetaminophen (TYLENOL) tablet 650 mg  650 mg Oral Q6H PRN Nanci Pina, FNP   650 mg at 06/29/16 1884  . albuterol (PROVENTIL HFA;VENTOLIN HFA) 108 (90 Base) MCG/ACT inhaler 1 puff  1 puff Inhalation Q6H PRN Myer Peer Cobos, MD      . alum & mag hydroxide-simeth (MAALOX/MYLANTA) 200-200-20 MG/5ML suspension 30 mL  30 mL Oral Q4H PRN Nanci Pina, FNP      . ARIPiprazole (ABILIFY) tablet 2 mg  2 mg Oral QHS Encarnacion Slates, NP   2 mg at 06/28/16 2040  . cloNIDine (CATAPRES) tablet 0.1 mg  0.1 mg Oral TID PRN Derrill Center, NP      . FLUoxetine (PROZAC) capsule 20 mg  20 mg Oral Daily Jenne Campus, MD   20 mg at 06/29/16 0813  . gabapentin (NEURONTIN) capsule 200 mg  200 mg Oral TID Myer Peer Cobos,  MD   200 mg at 06/29/16 1155  . hydrOXYzine (ATARAX/VISTARIL) tablet 25 mg  25 mg Oral Q6H PRN Nanci Pina, FNP   25 mg at 06/29/16 1353  . lisinopril (PRINIVIL,ZESTRIL) tablet 10 mg  10 mg Oral Daily Derrill Center, NP   10 mg at 06/29/16 0813  . loratadine (CLARITIN) tablet 10 mg  10 mg Oral Daily Kerrie Buffalo, NP   10 mg at 06/29/16 1313  . magnesium hydroxide (MILK OF MAGNESIA) suspension 30 mL  30 mL Oral Daily PRN Nanci Pina, FNP      . mometasone-formoterol (DULERA) 100-5 MCG/ACT inhaler 2 puff  2 puff Inhalation BID Jenne Campus, MD   2 puff at 06/29/16 0815  . neomycin-bacitracin-polymyxin (NEOSPORIN) ointment   Topical PRN Encarnacion Slates, NP      . nicotine (NICODERM CQ - dosed in mg/24 hours) patch 21 mg  21 mg  Transdermal Daily Nanci Pina, FNP   21 mg at 06/29/16 0815  . prazosin (MINIPRESS) capsule 1 mg  1 mg Oral QHS Encarnacion Slates, NP   1 mg at 06/28/16 2040  . topiramate (TOPAMAX) tablet 25 mg  25 mg Oral Daily Jenne Campus, MD   25 mg at 06/29/16 0813  . traZODone (DESYREL) tablet 50 mg  50 mg Oral QHS,MR X 1 Laverle Hobby, PA-C   50 mg at 06/28/16 2228    Lab Results:  Results for orders placed or performed during the hospital encounter of 06/23/16 (from the past 48 hour(s))  Lipid panel     Status: None   Collection Time: 06/28/16  6:01 AM  Result Value Ref Range   Cholesterol 140 0 - 200 mg/dL   Triglycerides 73 <150 mg/dL   HDL 65 >40 mg/dL   Total CHOL/HDL Ratio 2.2 RATIO   VLDL 15 0 - 40 mg/dL   LDL Cholesterol 60 0 - 99 mg/dL    Comment:        Total Cholesterol/HDL:CHD Risk Coronary Heart Disease Risk Table                     Men   Women  1/2 Average Risk   3.4   3.3  Average Risk       5.0   4.4  2 X Average Risk   9.6   7.1  3 X Average Risk  23.4   11.0        Use the calculated Patient Ratio above and the CHD Risk Table to determine the patient's CHD Risk.        ATP III CLASSIFICATION (LDL):  <100     mg/dL   Optimal  100-129  mg/dL   Near or Above                    Optimal  130-159  mg/dL   Borderline  160-189  mg/dL   High  >190     mg/dL   Very High Performed at [facility]   Prolactin     Status: Abnormal   Collection Time: 06/28/16  6:01 AM  Result Value Ref Range   Prolactin 16.6 (H) 4.0 - 15.2 ng/mL    Comment: (NOTE) Performed At: [facility] [place], [place] [id number] Lindon Romp MD YD:[id number] Performed at [facility]     Blood Alcohol level:  No results found for: [facility]  Metabolic Disorder Labs: Lab  Results  Component Value Date   HGBA1C 5.5 06/27/2016   MPG 111 06/27/2016   Lab Results  Component Value Date   PROLACTIN 16.6* 06/28/2016   Lab Results  Component  Value Date   CHOL 140 06/28/2016   TRIG 73 06/28/2016   HDL 65 06/28/2016   CHOLHDL 2.2 06/28/2016   VLDL 15 06/28/2016   LDLCALC 60 06/28/2016    Physical Findings: AIMS: Facial and Oral Movements Muscles of Facial Expression: None, normal Lips and Perioral Area: None, normal Jaw: None, normal Tongue: None, normal,Extremity Movements Upper (arms, wrists, hands, fingers): None, normal Lower (legs, knees, ankles, toes): None, normal, Trunk Movements Neck, shoulders, hips: None, normal, Overall Severity Severity of abnormal movements (highest score from questions above): None, normal Incapacitation due to abnormal movements: None, normal Patient's awareness of abnormal movements (rate only patient's report): No Awareness, Dental Status Current problems with teeth and/or dentures?: No Does patient usually wear dentures?: No  CIWA:    COWS:     Musculoskeletal: Strength & Muscle Tone: within normal limits Gait & Station: normal Patient leans: N/A  Psychiatric Specialty Exam: Physical Exam  Nursing note and vitals reviewed. Constitutional: He is oriented to person, place, and time. He appears well-developed.  HENT:  Head: Normocephalic.  Cardiovascular: Normal rate.   Musculoskeletal: Normal range of motion.  Neurological: He is alert and oriented to person, place, and time.  Psychiatric: He has a normal mood and affect. His behavior is normal.    Review of Systems  Psychiatric/Behavioral: Positive for depression. Negative for suicidal ideas, hallucinations and substance abuse. The patient is nervous/anxious.   All other systems reviewed and are negative.   Blood pressure 107/72, pulse 99, temperature 97.6 F (36.4 C), temperature source Oral, resp. rate 16, height 5' 7.72" (1.72 m), weight 134 lb (60.782 kg).Body mass index is 20.55 kg/(m^2).  General Appearance: improved grooming   Eye Contact:  Good  Speech:  Clear and Coherent  Volume:  Normal  Mood: improved , but  still depressed, anxious   Affect:  Anxious , mildly constricted   Thought Process:  Linear  Orientation:  Full (Time, Place, and Person)  Thought Content: at this time no hallucinations, no delusions, not internally preoccupied   Suicidal Thoughts:  No denies suicidal ideations, denies any self injurious ideations - contracts for safety on the unit   Homicidal Thoughts:  No denies any homicidal or violent ideations   Memory:  Recent and remote grossly intact   Judgement:  Improving   Insight:  Improving   Psychomotor Activity:  Normal  Concentration:  Concentration: Good and Attention Span: Good  Recall:  Good  Fund of Knowledge:  Good  Language:  Good  Akathisia:  No  Handed:  Right  AIMS (if indicated):     Assets:  Communication Skills Desire for Improvement Resilience Social Support  ADL's:  Intact  Cognition:  WNL  Sleep:  Number of Hours: 6.25   Assessment -  Patient reports ongoing symptoms of depression, anxiety, some social anxiety symptoms, but overall has improved compared to admission, and at this time denies suicidal ideations. Currently tolerating medications well Less nightmares on Minipress, but sleep remains fair .  Treatment Plan Summary: Encourage group and milieu participation to work on coping skills and symptom reduction Continue PROZAC 20 mgrs Q DAY  for depression, anxiety   Continue  Abilify 2 mg for mood instability/adjunct treatment for depression.  Continue Minipress 1 mg QHS PTSD symptoms. Increase  Trazodone to  100  mgrs QHS for insomnia Increase  NEURONTIN 200 mgrs TID for anxiety and chronic pain, side effects reviewed  Continue  TOPAMAX 25 mgrs QDAY for migraine, headache prophylaxis, may also help mood , side effects discussed  Treatment team  working on disposition planning options . Marland Kitchen  Neita Garnet, MD 06/29/2016, 1:59 PM

## 2016-06-29 NOTE — Progress Notes (Signed)
Patient ID: Andris BaumannRoy J Macapagal, male   DOB: [birth year], 30 y.o.   MRN: [id number]  Pt currently presents with an animated affect and anxious, restless behavior. Per self inventory, pt rates depression at a 9, hopelessness 8 and anxiety 6. Pt's daily goal is to "what diagnosis is and to get well" and they intend to do so by "talk with dr." Pt reports poor sleep, a poor appetite, low energy and poor concentration. Pt complains of "migraines", identifies pressure above his eyes and in his anterior forehead as the source of current pain.   Pt provided with medications per providers orders. Pt's labs and vitals were monitored throughout the day. Pt supported emotionally and encouraged to express concerns and questions. Pt educated on medications. Provider notified of pt medical concerns.   Pt's safety ensured with 15 minute and environmental checks. Pt currently denies SI/HI and A/V hallucinations. Pt verbally agrees to seek staff if SI/HI or A/VH occurs and to consult with staff before acting on any harmful thoughts. Will continue POC.

## 2016-06-29 NOTE — Progress Notes (Signed)
Recreation Therapy Notes  Date: 07.19.2017 Time: 9:30am Location: [address] Group Room   Group Topic: Stress Management  Goal Area(s) Addresses:  Patient will actively participate in stress management techniques presented during session.   Behavioral Response: Engaged, Attentive   Intervention: Stress management techniques  Activity :  Deep Breathing and Guided Imagery. LRT provided education, instruction and demonstration on practice of  Deep Breathing and Guided Imagery. Patient was asked to participate in technique introduced during session.   Education:  Stress Management, Discharge Planning.   Education Outcome: Acknowledges education  Clinical Observations/Feedback: Patient actively engaged in technique introduced, expressed no concerns and demonstrated ability to practice independently post d/c.   Marykay Lexenise L Macall Mccroskey, LRT/CTRS        Rhianon Zabawa L 06/29/2016 3:24 PM

## 2016-06-29 NOTE — Significant Event (Signed)
Patient is a 30 y/o WM presenting to nursing staff with c/o of right flank pain/ RLQ abd pain, over the last hour. Patient is denying any nausea, vomiting or change in bowel habits. Mr. Edward Stone does endorse a subtle change in his urine quality, urinating an amber colored urine twice today. He also gives a remote hx of Kidney stones and abuses alcohol. On exam he has a positive Rt flank pain and Psoas Sign on exam. [facility]. Toradol 30 mg IM x one given  Agree with NP progress note as above

## 2016-06-29 NOTE — ED Notes (Signed)
[facility], he complains of flank pain and they want him evaluated for appendicitis and/or kidney stones

## 2016-06-29 NOTE — ED Provider Notes (Addendum)
CSN: [id number]     Arrival date & time 06/29/16  2145 History   First MD Initiated Contact with Patient 06/29/16 2204     Chief Complaint  Patient presents with  . Referral  . Flank Pain     (Consider location/radiation/quality/duration/timing/severity/associated sxs/prior Treatment) Patient is a 30 y.o. male presenting with flank pain. The history is provided by the patient.  Flank Pain Pertinent negatives include no chest pain, no abdominal pain, no headaches and no shortness of breath.  Patient c/o acute onset right flank pain tonight, at rest. [facility] he is being treated for depression, anxiety, and PTSD.  Patient indicates acute onset severe right flank pain. No radiation. Denies back or flank injury or strain. No fever or chills. No dysuria or hematuria. No scrotal or testicular pain. Patient indicates remote hx kidney stone, unsure if same.       Past Medical History  Diagnosis Date  . Asthma   . Anxiety    History reviewed. No pertinent past surgical history. History reviewed. No pertinent family history. Social History  Substance Use Topics  . Smoking status: Current Every Day Smoker -- 1.00 packs/day    Types: Cigarettes  . Smokeless tobacco: Current User    Types: Chew  . Alcohol Use: Yes    Review of Systems  Constitutional: Negative for fever.  HENT: Negative for sore throat.   Eyes: Negative for redness.  Respiratory: Negative for cough and shortness of breath.   Cardiovascular: Negative for chest pain.  Gastrointestinal: Negative for nausea, vomiting, abdominal pain and diarrhea.  Genitourinary: Positive for flank pain. Negative for dysuria and hematuria.  Musculoskeletal: Negative for neck pain.  Skin: Negative for rash.  Neurological: Negative for headaches.  Hematological: Does not bruise/bleed easily.  Psychiatric/Behavioral: Negative for confusion.      Allergies  Review of patient's allergies indicates no known  allergies.  Home Medications   Prior to Admission medications   Not on File   BP 116/79 mmHg  Pulse 76  Temp(Src) 98.1 F (36.7 C) (Oral)  Resp 18  Ht 5' 7.72" (1.72 m)  Wt 60.782 kg  BMI 20.55 kg/m2  SpO2 99% Physical Exam  Constitutional: He appears well-developed and well-nourished. No distress.  HENT:  Mouth/Throat: Oropharynx is clear and moist.  Eyes: Conjunctivae are normal. No scleral icterus.  Neck: Neck supple. No tracheal deviation present.  Cardiovascular: Normal rate, regular rhythm, normal heart sounds and intact distal pulses.   Pulmonary/Chest: Effort normal and breath sounds normal. No accessory muscle usage. No respiratory distress.  Abdominal: Soft. Bowel sounds are normal. He exhibits no distension and no mass. There is no tenderness. There is no rebound and no guarding.  Genitourinary:  No scrotal or testicular pain or tenderness. No cva tenderness.   Musculoskeletal: He exhibits no edema.  Neurological: He is alert.  Skin: Skin is warm and dry. He is not diaphoretic.  No rash/shingles to area of pain.   Psychiatric: He has a normal mood and affect.  Nursing note and vitals reviewed.   ED Course  Procedures (including critical care time) Labs Review  Ct Renal Stone Study  06/30/2016  CLINICAL DATA:  Right flank pain EXAM: CT ABDOMEN AND PELVIS WITHOUT CONTRAST TECHNIQUE: Multidetector CT imaging of the abdomen and pelvis was performed following the standard protocol without IV contrast. COMPARISON:  06/03/2014 FINDINGS: Lower chest and abdominal wall:  No contributory findings. Hepatobiliary: No focal liver abnormality.No evidence of biliary obstruction or  stone. Pancreas: Unremarkable. Spleen: Unremarkable. Adrenals/Urinary Tract: Negative adrenals. Two neighboring calcifications in the interpolar left kidney measuring 4 mm each on coronal reformats. No hydronephrosis or ureteral calculus. Presumed 12 mm left lower pole cyst. Unremarkable bladder.  Stomach/Bowel:  No obstruction. No appendicitis. Reproductive:No pathologic findings. Vascular/Lymphatic: No acute vascular abnormality. No mass or adenopathy. Other: No ascites or pneumoperitoneum. Musculoskeletal: No acute abnormalities. IMPRESSION: 1. No acute finding.  No hydronephrosis or ureteral calculus. 2. Left nephrolithiasis. Electronically Signed   By: Marnee Spring M.D.   On: 06/30/2016 00:09     I have personally reviewed and evaluated these images as part of my medical decision-making.   MDM   Iv ns. toradol iv. Dilaudid 1 mg iv.  Ct.   Reviewed nursing notes and prior charts for additional history.   Patient initially appeared very uncomfortable.  On recheck post ct is alert, conversant, and in no apparent pain or discomfort.  Pt afeb. abd soft nt.  No dysuria, no cva tenderness. TLS spine nt.   Symptoms appear to have resolved. Patient currently appears stable for transport back to [facility].       Cathren Laine, MD 06/30/16 [phone]

## 2016-06-29 NOTE — Progress Notes (Signed)
D: Pt was in the hallway upon initial approach.  Pt has appropriate affect and anxious mood.  Reports his day has been "a little stressful, I've had a lot of anxiety and depression."  Pt reports he had flashbacks last night.  Pt denies SI/HI, denies hallucinations.  Pt has been visible in milieu.  Pt attended evening group.   A: Introduced self to pt.  Met with pt and offered support and encouragement.  Actively listened to pt.  Medication education provided.  Medications administered per order.   R: Pt is compliant with medications.  Pt verbally contracts for safety and reports he will inform staff of needs and concerns.  Will continue to monitor and assess.

## 2016-06-29 NOTE — Progress Notes (Signed)
Per PA pt needs to be evaluated , pt possibly has Kidney stones, but PA would like pt to be evaluated at ED for possible nephrolithiasis R/O appendicitis.

## 2016-06-29 NOTE — Progress Notes (Signed)
Adult Psychoeducational Group Note  Date:  06/29/2016 Time:  2:14 PM  Group Topic/Focus:  Goals Group:   The focus of this group is to help patients establish daily goals to achieve during treatment and discuss how the patient can incorporate goal setting into their daily lives to aide in recovery.  Participation Level:  Active  Participation Quality:  Appropriate  Affect:  Appropriate  Cognitive:  Alert and Appropriate  Insight: Appropriate and Good  Engagement in Group:  Engaged and Improving  Modes of Intervention:  Discussion  Additional Comments:  Pt states that he is no longer suicidal but would like to learn more about his diagnosis.  Pt states that he wants to get his medication on track and would like to share his story with others in hopes to change and improve their lives as well.  Trinika Cortese R Lucille Crichlow 06/29/2016, 2:14 PM

## 2016-06-29 NOTE — Progress Notes (Signed)
Pt stated he was feeling a little better. Pt having mid back pain 10/ 10 . Pt given tylenol and ice to re-evaluate . PA will be notified upon arrival .

## 2016-06-29 NOTE — [facility] Group Notes (Signed)
[facility] L[facility] Discharge Planning [facility] Note   06/29/2016 10:00 AM  Participation Quality:  Minimal   Mood/Affect:  Depressed and Flat  Depression Rating:  "high"   Anxiety Rating:  "high"  Thoughts of Suicide:  No Will you contract for safety?   NA  Current AVH:  No  Plan for Discharge/Comments:  Pt reports that he has a migraine, high depression, high anxiety, and continues to get poor sleep. Pt's affect brightens as group ends and he becomes pleasant, outgoing, and joking with others. Patient continues to ask if he has bipolar dx. Pt requesing to see Dr. Jama Flavorsobos for medication changes.  Transportation Means: unknown at this time.   Supports: some family supports identified   Counselling psychologistmart, Conservation officer, natureHeather LCSW

## 2016-06-30 DIAGNOSIS — F333 Major depressive disorder, recurrent, severe with psychotic symptoms: Secondary | ICD-10-CM | POA: Diagnosis not present

## 2016-06-30 MED ORDER — GABAPENTIN 400 MG PO CAPS
400.0000 mg | ORAL_CAPSULE | Freq: Three times a day (TID) | ORAL | Status: DC
  Administered 2016-07-01 (×2): 400 mg via ORAL
  Filled 2016-06-30 (×7): qty 1

## 2016-06-30 MED ORDER — GABAPENTIN 300 MG PO CAPS
300.0000 mg | ORAL_CAPSULE | Freq: Three times a day (TID) | ORAL | Status: DC
  Administered 2016-06-30: 300 mg via ORAL
  Filled 2016-06-30 (×5): qty 1

## 2016-06-30 MED ORDER — KETOROLAC TROMETHAMINE 30 MG/ML IJ SOLN
30.0000 mg | Freq: Once | INTRAMUSCULAR | Status: AC
  Administered 2016-06-30: 30 mg via INTRAMUSCULAR
  Filled 2016-06-30 (×2): qty 1

## 2016-06-30 MED ORDER — OXYCODONE HCL 5 MG PO TABS
5.0000 mg | ORAL_TABLET | Freq: Two times a day (BID) | ORAL | Status: DC | PRN
  Administered 2016-06-30 – 2016-07-01 (×2): 5 mg via ORAL
  Filled 2016-06-30 (×2): qty 1

## 2016-06-30 NOTE — Progress Notes (Signed)
[facility] MD Progress Note  06/30/2016 12:33 PM Nhia Heaphy [facility]  MRN:  [id number]   Subjective:  Patient is preoccupied with his pain.  He was recently evaluated in the ED for nephrolithiasis with positive results.  Denies medication side effects. Feels medications are helping partially  He grimaced during the assessment.    Objective:  I have discussed case with treatment team and have met with patient . Denies any cravings. States some improvement with mood and depression.  Patient asked when he would be discharged. Visible on the unit, going to some groups .  Principal Problem: Severe recurrent major depressive disorder with psychotic features ([place]) Diagnosis:   Patient Active Problem List   Diagnosis Date Noted  . Severe recurrent major depressive disorder with psychotic features ([place]) [F33.3] 06/28/2016  . Suicidal behavior [F48.9] 06/23/2016   Total Time spent with patient: 25 minutes   Past Psychiatric History: See Above  Past Medical History:  Past Medical History  Diagnosis Date  . Asthma   . Anxiety    History reviewed. No pertinent past surgical history. Family History: History reviewed. No pertinent family history. Family Psychiatric  History: See Above Social History:  History  Alcohol Use  . Yes     History  Drug Use  . Yes  . Special: Marijuana    Social History   Social History  . Marital Status: Single    Spouse Name: N/A  . Number of Children: N/A  . Years of Education: N/A   Social History Main Topics  . Smoking status: Current Every Day Smoker -- 1.00 packs/day    Types: Cigarettes  . Smokeless tobacco: Current User    Types: Chew  . Alcohol Use: Yes  . Drug Use: Yes    Special: Marijuana  . Sexual Activity: Not Currently   Other Topics Concern  . None   Social History Narrative   Additional Social History:   Sleep: Fair  Appetite:  Good  Current Medications: Current Facility-Administered Medications  Medication Dose Route Frequency  Provider Last Rate Last Dose  . 0.9 %  sodium chloride infusion   Intravenous Continuous Lajean Saver, MD 10 mL/hr at 06/29/16 2310 1,000 mL at 06/29/16 2310  . acetaminophen (TYLENOL) tablet 650 mg  650 mg Oral Q6H PRN Nanci Pina, FNP   650 mg at 06/30/16 0800  . albuterol (PROVENTIL HFA;VENTOLIN HFA) 108 (90 Base) MCG/ACT inhaler 1 puff  1 puff Inhalation Q6H PRN Myer Peer Glenroy Crossen, MD      . alum & mag hydroxide-simeth (MAALOX/MYLANTA) 200-200-20 MG/5ML suspension 30 mL  30 mL Oral Q4H PRN Nanci Pina, FNP      . ARIPiprazole (ABILIFY) tablet 2 mg  2 mg Oral QHS Encarnacion Slates, NP   2 mg at 06/30/16 0115  . cloNIDine (CATAPRES) tablet 0.1 mg  0.1 mg Oral TID PRN Derrill Center, NP      . FLUoxetine (PROZAC) capsule 20 mg  20 mg Oral Daily Jenne Campus, MD   20 mg at 06/30/16 0755  . gabapentin (NEURONTIN) capsule 200 mg  200 mg Oral TID Jenne Campus, MD   200 mg at 06/30/16 1153  . hydrOXYzine (ATARAX/VISTARIL) tablet 25 mg  25 mg Oral Q6H PRN Nanci Pina, FNP   25 mg at 06/30/16 0803  . lisinopril (PRINIVIL,ZESTRIL) tablet 10 mg  10 mg Oral Daily Derrill Center, NP   10 mg at 06/30/16 0755  . loratadine (CLARITIN) tablet 10 mg  10 mg Oral Daily Kerrie Buffalo, NP   10 mg at 06/30/16 0755  . magnesium hydroxide (MILK OF MAGNESIA) suspension 30 mL  30 mL Oral Daily PRN Nanci Pina, FNP      . mometasone-formoterol (DULERA) 100-5 MCG/ACT inhaler 2 puff  2 puff Inhalation BID Jenne Campus, MD   2 puff at 06/30/16 0754  . neomycin-bacitracin-polymyxin (NEOSPORIN) ointment   Topical PRN Encarnacion Slates, NP      . nicotine (NICODERM CQ - dosed in mg/24 hours) patch 21 mg  21 mg Transdermal Daily Nanci Pina, FNP   21 mg at 06/30/16 0754  . prazosin (MINIPRESS) capsule 1 mg  1 mg Oral QHS Encarnacion Slates, NP   1 mg at 06/30/16 0111  . topiramate (TOPAMAX) tablet 25 mg  25 mg Oral Daily Jenne Campus, MD   25 mg at 06/30/16 0755  . traZODone (DESYREL) tablet 100 mg  100 mg  Oral QHS Jenne Campus, MD   100 mg at 06/30/16 0112    Lab Results:  No results found for this or any previous visit (from the past 78 hour(s)).  Blood Alcohol level:  No results found for: [facility]  Metabolic Disorder Labs: Lab Results  Component Value Date   HGBA1C 5.5 06/27/2016   MPG 111 06/27/2016   Lab Results  Component Value Date   PROLACTIN 16.6* 06/28/2016   Lab Results  Component Value Date   CHOL 140 06/28/2016   TRIG 73 06/28/2016   HDL 65 06/28/2016   CHOLHDL 2.2 06/28/2016   VLDL 15 06/28/2016   LDLCALC 60 06/28/2016    Physical Findings: AIMS: Facial and Oral Movements Muscles of Facial Expression: None, normal Lips and Perioral Area: None, normal Jaw: None, normal Tongue: None, normal,Extremity Movements Upper (arms, wrists, hands, fingers): None, normal Lower (legs, knees, ankles, toes): None, normal, Trunk Movements Neck, shoulders, hips: None, normal, Overall Severity Severity of abnormal movements (highest score from questions above): None, normal Incapacitation due to abnormal movements: None, normal Patient's awareness of abnormal movements (rate only patient's report): No Awareness, Dental Status Current problems with teeth and/or dentures?: No Does patient usually wear dentures?: No  CIWA:    COWS:     Musculoskeletal: Strength & Muscle Tone: within normal limits Gait & Station: normal Patient leans: N/A  Psychiatric Specialty Exam: Physical Exam  Nursing note and vitals reviewed. Constitutional: He is oriented to person, place, and time. He appears well-developed.  HENT:  Head: Normocephalic.  Cardiovascular: Normal rate.   Musculoskeletal: Normal range of motion.  Neurological: He is alert and oriented to person, place, and time.  Psychiatric: He has a normal mood and affect. His behavior is normal.    Review of Systems  Psychiatric/Behavioral: Positive for depression. Negative for suicidal ideas, hallucinations and substance  abuse. The patient is nervous/anxious.   All other systems reviewed and are negative.   Blood pressure 130/78, pulse 104, temperature 97.6 F (36.4 C), temperature source Oral, resp. rate 16, height 5' 7.72" (1.72 m), weight 60.782 kg (134 lb), SpO2 99 %.Body mass index is 20.55 kg/(m^2).  General Appearance: improved grooming   Eye Contact:  Good  Speech:  Clear and Coherent  Volume:  Normal  Mood: improved , but still depressed, anxious   Affect:  Anxious , mildly constricted   Thought Process:  Linear  Orientation:  Full (Time, Place, and Person)  Thought Content: at this time no hallucinations, no delusions, not internally preoccupied  Suicidal Thoughts:  No denies suicidal ideations, denies any self injurious ideations - contracts for safety on the unit   Homicidal Thoughts:  No denies any homicidal or violent ideations   Memory:  Recent and remote grossly intact   Judgement:  Improving   Insight:  Improving   Psychomotor Activity:  Normal  Concentration:  Concentration: Good and Attention Span: Good  Recall:  Good  Fund of Knowledge:  Good  Language:  Good  Akathisia:  No  Handed:  Right  AIMS (if indicated):     Assets:  Communication Skills Desire for Improvement Resilience Social Support  ADL's:  Intact  Cognition:  WNL  Sleep:  Number of Hours: 4   Assessment -  Patient reports ongoing symptoms of depression, anxiety, some social anxiety symptoms, but overall has improved compared to admission, and at this time denies suicidal ideations. Currently tolerating medications well Less nightmares on Minipress, but sleep remains fair .  Treatment Plan Summary: Encourage group and milieu participation to work on coping skills and symptom reduction Continue PROZAC 20 mgrs Q DAY  for depression, anxiety   Continue  Abilify 2 mg for mood instability/adjunct treatment for depression.  Continue Minipress 1 mg QHS PTSD symptoms. Increase  Trazodone to 100  mgrs QHS for  insomnia Increase  NEURONTIN to 300 mgrs TID for anxiety and chronic pain, side effects reviewed  Continue  TOPAMAX 25 mgrs QDAY for migraine, headache prophylaxis, may also help mood , side effects discussed  Treatment team  working on disposition planning options . Added Oxy IR 5 mg BID PRN for flank pain/nephrolithiasis  Janett Labella, NP [facility] 06/30/2016, 12:33 PM Agree with NP progress note as above

## 2016-06-30 NOTE — Progress Notes (Signed)
D: Patient's self inventory sheet, patient has fair sleep, sleep medication is helpful.  Poor appetite, low energy level, poor concentration.  Rated depression 8, hopeless 6, anxiety 7.  Denied withdrawals.  Denied SI.  Physical problems, pain, low back and flank areas.  Worst pain in past 24 hours is #10.  Pain medication is not helpful.  Goal is pain control.  Plans to tell nurse as soon as pain starts.  Does have discharge plans. A:  Medications administered per MD orders.  Emotional support and encouragement given patient. R:  Denied SI and HI, contracts for safety.  Safety maintained with 15 minute checks.

## 2016-06-30 NOTE — ED Notes (Signed)
[facility]

## 2016-06-30 NOTE — Progress Notes (Signed)
Per MHT , pt stated he did not think he was ready to leave. Pt stated he needed more time here, pt believes he is bipolar and needs more help. Pt stated he would do something to stay if he feels he is going to be D/C before he is ready.

## 2016-06-30 NOTE — [facility] Group Notes (Signed)
[facility] LCSW Group Therapy  06/30/2016 12:46 PM  Type of Therapy:  Group Therapy  Participation Level:  Active  Participation Quality:  Attentive  Affect:  Irritable  Cognitive:  Oriented  Insight:  Limited  Engagement in Therapy:  Improving  Modes of Intervention:  Confrontation, Discussion, Education, Exploration, Problem-solving, Rapport Building, Socialization and Support  Summary of Progress/Problems: Emotion Regulation: This group focused on both positive and negative emotion identification and allowed group members to process ways to identify feelings, regulate negative emotions, and find healthy ways to manage internal/external emotions. Group members were asked to reflect on a time when their reaction to an emotion led to a negative outcome and explored how alternative responses using emotion regulation would have benefited them. Group members were also asked to discuss a time when emotion regulation was utilized when a negative emotion was experienced. Edward Stone was attentive and engaged during today's processing group. He shared that he struggles with anxiety. "I need some medication that is faster acting." Edward Stone continues to struggle with emotion regulation, even in the group setting. "I'm upset because I'm in pain and it's not being controlled." Edward Stone continues to be focused on medication management and is hoping to [facility] [place].   Smart, Diane Mochizuki LCSW 06/30/2016, 12:46 PM

## 2016-06-30 NOTE — Plan of Care (Signed)
Problem: Education: Goal: Ability to make informed decisions regarding treatment will improve Outcome: Progressing Nurse discussed suicidal thoughts/depression and coping skills with patient.

## 2016-06-30 NOTE — Progress Notes (Signed)
Follow up with Edward Stone for continued emotional support.  Edward Stone is hopeful about discharge plans.  Journey of recovery, ways to manage anxiety, supports in life.     Edward Stone, Edward Stone MDiv

## 2016-06-30 NOTE — Progress Notes (Signed)
Patient ID: Edward Stone, male   DOB: 03-31-1986, 30 y.o.   MRN: [id number] D: Client visible on the floor, seen in dayroom interacting, also on the phone. Client continues to ask about increasing and changing dosages of medications. Client also seen by staff mentoring other client's on what medications to ask for and what times they should be scheduled. A: Writer reviewed medications, administered as ordered. Staff will monitor q2615min for safety. R:Client is safe on the unit, attended karaoke.

## 2016-06-30 NOTE — [facility] Group Notes (Signed)
[facility] Group Notes:  (Nursing/MHT/Case Management/Adjunct)  Date:  06/30/2016  Time:  2:23 PM  Type of Therapy:  Psychoeducational Skills  Participation Level:  Minimal  Participation Quality:  Resistant  Affect:  Blunted  Cognitive:  Oriented  Insight:  Lacking  Engagement in Group:  Limited  Modes of Intervention:  Discussion and Education  Summary of Progress/Problems: Channing MuttersRoy attended group and was minimally engaged.   Marzetta BoardDopson, Tewana Bohlen E 06/30/2016, 2:23 PM

## 2016-07-01 ENCOUNTER — Emergency Department (HOSPITAL_COMMUNITY): Admission: EM | Admit: 2016-07-01 | Payer: Self-pay | Source: Home / Self Care

## 2016-07-01 ENCOUNTER — Encounter (HOSPITAL_COMMUNITY): Payer: Self-pay | Admitting: Emergency Medicine

## 2016-07-01 DIAGNOSIS — F333 Major depressive disorder, recurrent, severe with psychotic symptoms: Secondary | ICD-10-CM | POA: Diagnosis not present

## 2016-07-01 LAB — URINALYSIS, ROUTINE W REFLEX MICROSCOPIC
BILIRUBIN URINE: NEGATIVE
GLUCOSE, UA: NEGATIVE mg/dL
HGB URINE DIPSTICK: NEGATIVE
KETONES UR: NEGATIVE mg/dL
Nitrite: NEGATIVE
PH: 8.5 — AB (ref 5.0–8.0)
Protein, ur: NEGATIVE mg/dL
Specific Gravity, Urine: 1.008 (ref 1.005–1.030)

## 2016-07-01 LAB — URINE MICROSCOPIC-ADD ON

## 2016-07-01 MED ORDER — DOXYCYCLINE HYCLATE 100 MG PO CAPS: 100.0000 mg | ORAL_CAPSULE | Freq: Two times a day (BID) | ORAL | Status: DC

## 2016-07-01 MED ORDER — MOMETASONE FURO-FORMOTEROL FUM 100-5 MCG/ACT IN AERO
2.0000 | INHALATION_SPRAY | Freq: Two times a day (BID) | RESPIRATORY_TRACT | Status: DC
  Administered 2016-07-01 – 2016-07-02 (×2): 2 via RESPIRATORY_TRACT
  Filled 2016-07-01: qty 8.8

## 2016-07-01 MED ORDER — BACITRACIN-NEOMYCIN-POLYMYXIN OINTMENT TUBE
TOPICAL_OINTMENT | Freq: Every day | CUTANEOUS | Status: DC
  Filled 2016-07-01: qty 15

## 2016-07-01 MED ORDER — ARIPIPRAZOLE 2 MG PO TABS
2.0000 mg | ORAL_TABLET | Freq: Every day | ORAL | Status: DC
  Administered 2016-07-01: 2 mg via ORAL
  Filled 2016-07-01 (×2): qty 1
  Filled 2016-07-01: qty 7

## 2016-07-01 MED ORDER — NICOTINE 21 MG/24HR TD PT24
21.0000 mg | MEDICATED_PATCH | Freq: Every day | TRANSDERMAL | Status: DC
  Administered 2016-07-02: 21 mg via TRANSDERMAL
  Filled 2016-07-01 (×3): qty 1

## 2016-07-01 MED ORDER — GABAPENTIN 400 MG PO CAPS
400.0000 mg | ORAL_CAPSULE | Freq: Three times a day (TID) | ORAL | Status: DC
  Administered 2016-07-02 (×2): 400 mg via ORAL
  Filled 2016-07-01: qty 21
  Filled 2016-07-01 (×2): qty 1
  Filled 2016-07-01: qty 21
  Filled 2016-07-01: qty 1
  Filled 2016-07-01: qty 21
  Filled 2016-07-01: qty 1

## 2016-07-01 MED ORDER — IBUPROFEN 800 MG PO TABS
800.0000 mg | ORAL_TABLET | Freq: Three times a day (TID) | ORAL | Status: DC | PRN
  Administered 2016-07-01: 800 mg via ORAL
  Filled 2016-07-01: qty 1

## 2016-07-01 MED ORDER — ACETAMINOPHEN 325 MG PO TABS
650.0000 mg | ORAL_TABLET | Freq: Four times a day (QID) | ORAL | Status: DC | PRN
Start: 2016-07-01 — End: 2016-07-03

## 2016-07-01 MED ORDER — KETOROLAC TROMETHAMINE 30 MG/ML IJ SOLN
30.0000 mg | Freq: Once | INTRAMUSCULAR | Status: AC
  Administered 2016-07-01: 30 mg via INTRAVENOUS
  Filled 2016-07-01: qty 1

## 2016-07-01 MED ORDER — HYDROXYZINE HCL 25 MG PO TABS
25.0000 mg | ORAL_TABLET | Freq: Four times a day (QID) | ORAL | Status: DC | PRN
  Administered 2016-07-01: 25 mg via ORAL
  Filled 2016-07-01: qty 1

## 2016-07-01 MED ORDER — CLONIDINE HCL 0.1 MG PO TABS: 0.1000 mg | ORAL_TABLET | Freq: Three times a day (TID) | ORAL | Status: DC | PRN

## 2016-07-01 MED ORDER — TOPIRAMATE 25 MG PO TABS
25.0000 mg | ORAL_TABLET | Freq: Every day | ORAL | Status: DC
  Administered 2016-07-02: 25 mg via ORAL
  Filled 2016-07-01: qty 7
  Filled 2016-07-01 (×2): qty 1

## 2016-07-01 MED ORDER — SODIUM CHLORIDE 0.9 % IV BOLUS (SEPSIS)
1000.0000 mL | Freq: Once | INTRAVENOUS | Status: AC
  Administered 2016-07-01: 1000 mL via INTRAVENOUS

## 2016-07-01 MED ORDER — LISINOPRIL 10 MG PO TABS
10.0000 mg | ORAL_TABLET | Freq: Every day | ORAL | Status: DC
  Administered 2016-07-02: 10 mg via ORAL
  Filled 2016-07-01 (×2): qty 1
  Filled 2016-07-01: qty 7

## 2016-07-01 MED ORDER — MAGNESIUM HYDROXIDE 400 MG/5ML PO SUSP: 30.0000 mL | Freq: Every day | ORAL | Status: DC | PRN

## 2016-07-01 MED ORDER — PRAZOSIN HCL 1 MG PO CAPS
1.0000 mg | ORAL_CAPSULE | Freq: Every day | ORAL | Status: DC
  Administered 2016-07-01: 1 mg via ORAL
  Filled 2016-07-01: qty 7
  Filled 2016-07-01 (×2): qty 1

## 2016-07-01 MED ORDER — TRAZODONE HCL 100 MG PO TABS
100.0000 mg | ORAL_TABLET | Freq: Every day | ORAL | Status: DC
  Administered 2016-07-01: 100 mg via ORAL
  Filled 2016-07-01 (×2): qty 1
  Filled 2016-07-01: qty 7

## 2016-07-01 MED ORDER — ALBUTEROL SULFATE HFA 108 (90 BASE) MCG/ACT IN AERS: 1.0000 | INHALATION_SPRAY | Freq: Four times a day (QID) | RESPIRATORY_TRACT | Status: DC | PRN

## 2016-07-01 MED ORDER — DOXYCYCLINE HYCLATE 100 MG PO TABS
100.0000 mg | ORAL_TABLET | Freq: Two times a day (BID) | ORAL | Status: DC
  Administered 2016-07-01 – 2016-07-02 (×2): 100 mg via ORAL
  Filled 2016-07-01: qty 1
  Filled 2016-07-01: qty 20
  Filled 2016-07-01: qty 1
  Filled 2016-07-01: qty 20
  Filled 2016-07-01 (×3): qty 1

## 2016-07-01 MED ORDER — OXYCODONE HCL 5 MG PO TABS
5.0000 mg | ORAL_TABLET | Freq: Two times a day (BID) | ORAL | Status: DC | PRN
  Administered 2016-07-01 – 2016-07-02 (×2): 5 mg via ORAL
  Filled 2016-07-01 (×2): qty 1

## 2016-07-01 MED ORDER — LORATADINE 10 MG PO TABS
10.0000 mg | ORAL_TABLET | Freq: Every day | ORAL | Status: DC
  Administered 2016-07-02: 10 mg via ORAL
  Filled 2016-07-01 (×3): qty 1

## 2016-07-01 MED ORDER — ALUM & MAG HYDROXIDE-SIMETH 200-200-20 MG/5ML PO SUSP: 30.0000 mL | ORAL | Status: DC | PRN

## 2016-07-01 MED ORDER — IBUPROFEN 800 MG PO TABS: 800.0000 mg | ORAL_TABLET | Freq: Three times a day (TID) | ORAL | Status: DC | PRN

## 2016-07-01 MED ORDER — FLUOXETINE HCL 20 MG PO CAPS
20.0000 mg | ORAL_CAPSULE | Freq: Every day | ORAL | Status: DC
  Administered 2016-07-02: 20 mg via ORAL
  Filled 2016-07-01: qty 1
  Filled 2016-07-01: qty 7
  Filled 2016-07-01: qty 1

## 2016-07-01 NOTE — Progress Notes (Signed)
  [facility] Discharge Plan :  Will you be returning to the same living situation after discharge:  Yes,  home At discharge, do you have transportation home?: Yes,  friend will come Saturday 7/22 after lunch to transport patient home. Do you have the ability to pay for your medications: Yes,  mental health  Release of information consent forms completed and submitted to medical records by CSW.  Patient to Follow up at: Follow-up Information    [facility]  On 07/04/2016.   Why:  Appt on this date at 9:45AM for hospital follow-up and assessment for services (medication management and counseling). Please bring the following: Photo ID, social security card, and proof of household income if you have it. Thank you.    Contact information:   [address] [place], [place] [address] Phone: [phone] Fax: [phone]      Next level of care provider has access to [facility] Link:no  Safety Planning and Suicide Prevention discussed: Yes,  SPE completed with pt's ex fiance. SPI pamphlet and Mobile Crisis information provided to pt and he was encouraged to share this information with his support network   Have you used any form of tobacco in the last 30 days? (Cigarettes, Smokeless Tobacco, Cigars, and/or Pipes): Yes  Has patient been referred to the Quitline?: Patient refused referral  Patient has been referred for addiction treatment: Yes  Smart, Sheylin Scharnhorst LCSW 07/01/2016, 1:09 PM

## 2016-07-01 NOTE — Progress Notes (Signed)
[facility], patient to be transported by non-emergent EMS with tech to remain with patient in the ED.

## 2016-07-01 NOTE — ED Notes (Signed)
Bed: WA04 Expected date:  Expected time:  Means of arrival:  Comments: Hold for triage 3 

## 2016-07-01 NOTE — Progress Notes (Signed)
Patient approached staff stating that he was passing a kidney stone and reporting 10/10 pain in back and lower urinary tract. MD notified, patient in dayroom.

## 2016-07-01 NOTE — Discharge Instructions (Signed)
Transport [facility] back to [facility].   Urinary Tract Infection A urinary tract infection (UTI) can occur any place along the urinary tract. The tract includes the kidneys, ureters, bladder, and urethra. A type of germ called bacteria often causes a UTI. UTIs are often helped with antibiotic medicine.  HOME CARE   If given, take antibiotics as told by your doctor. Finish them even if you start to feel better.  Drink enough fluids to keep your pee (urine) clear or pale yellow.  Avoid tea, drinks with caffeine, and bubbly (carbonated) drinks.  Pee often. Avoid holding your pee in for a long time.  Pee before and after having sex (intercourse).  Wipe from front to back after you poop (bowel movement) if you are a woman. Use each tissue only once. GET HELP RIGHT AWAY IF:   You have back pain.  You have lower belly (abdominal) pain.  You have chills.  You feel sick to your stomach (nauseous).  You throw up (vomit).  Your burning or discomfort with peeing does not go away.  You have a fever.  Your symptoms are not better in 3 days. MAKE SURE YOU:   Understand these instructions.  Will watch your condition.  Will get help right away if you are not doing well or get worse.   This information is not intended to replace advice given to you by your health care provider. Make sure you discuss any questions you have with your health care provider.   Document Released: 05/16/2008 Document Revised: 12/19/2014 Document Reviewed: 06/28/2012 Elsevier Interactive Patient Education [employer]2016 Elsevier Inc.

## 2016-07-01 NOTE — ED Notes (Signed)
Pt BH [facility] needs him in staffing. AC notified that there is no sitter for pt

## 2016-07-01 NOTE — Progress Notes (Signed)
D: Patient's self inventory sheet: patient has fair sleep, recieved sleep medication.fair  Appetite, low energy level, good concentration. Rated depression 7/10, hopeless 5/10, anxiety 7/10. SI/HI/AVH: denies. Physical complaints are kidney stones with back pain and headache. Goal is "getting well and talking openly". Plans to work on "talk to Dr and anyone I can, have a good day". Pt expressed anxiety about impending discharge, states that this is the anniversary of his brother's death and the birthday of his daughter who died. Received IVC paperwork and stated "I was in a full blown panic, I thought they were serving me with papers that said I was going immediately to prison". Patient very anxious appearing.    A: Medications administered, assessed medication knowledge and education given on medication regimen.  Emotional support and encouragement given patient. Encouraged to breath deeply and slowly for anxiety. R: Denies SI and HI , contracts for safety. Safety maintained with 15 minute checks.

## 2016-07-01 NOTE — ED Notes (Signed)
[facility] said it would be 20 minutes before transferring [facility]

## 2016-07-01 NOTE — Progress Notes (Signed)
Patient did attend the evening speaker AA meeting.  

## 2016-07-01 NOTE — Progress Notes (Signed)
Edward Stone rates Anxiety 7/10 and Depression 10/10. Denies SI/HI/AVH. Encouragement and support given. Medications administered as prescribed. Continue Q 15 minute checks for patient safety and medication effectiveness.

## 2016-07-01 NOTE — Progress Notes (Addendum)
[facility] dx with UTI and kidney stones. Sent with prescriptions, verbal order obtained from Tawny AsalSheila Augustin NP for doxycycline and ibuprofen. Prescriptions and AVS from ED put on chart.

## 2016-07-01 NOTE — Progress Notes (Signed)
Patient ID: Edward Stone, male   DOB: [birth year], 30 y.o.   MRN: [id number] [facility] MD Progress Note  07/01/2016 1:12 PM Edward Stone  MRN:  [id number]   Subjective:  Patient reports severe pain at this time- he has chronic pain, but states he has a lot of acute pain over his chronic pain at this time . He describes it as flank pain. He states " I think it is a kidney stone that is not passing through". He denies hematuria, but states his urine stream has been decreased " to just a trickle today". Denies medication side effects.  Objective:  I have discussed case with treatment team and have met with patient . Patient reports increased depression today associated with increased pain and also related to today being anniversary of child's birthday . Denies any suicidal ideations, but reports poor energy level, a sense of severe sadness, and anhedonia. No psychotic symptoms. Denies medication side effects. No disruptive behaviors on unit- as reviewed with staff, patient has been somatically focused, and focused on medication issues as well . At this time, as above, presents with acute, severe pain , describes as 10/10, and does appear visibly uncomfortable and restless, grimacing with pain. He has history of nephrolithiasis..  Principal Problem: Severe recurrent major depressive disorder with psychotic features ([place]) Diagnosis:   Patient Active Problem List   Diagnosis Date Noted  . Severe recurrent major depressive disorder with psychotic features ([place]) [F33.3] 06/28/2016  . Suicidal behavior [F48.9] 06/23/2016   Total Time spent with patient: 25 minutes   Past Psychiatric History: See Above  Past Medical History:  Past Medical History  Diagnosis Date  . Asthma   . Anxiety    History reviewed. No pertinent past surgical history. Family History: History reviewed. No pertinent family history. Family Psychiatric  History: See Above Social History:  History  Alcohol Use  . Yes     History   Drug Use  . Yes  . Special: Marijuana    Social History   Social History  . Marital Status: Single    Spouse Name: N/A  . Number of Children: N/A  . Years of Education: N/A   Social History Main Topics  . Smoking status: Current Every Day Smoker -- 1.00 packs/day    Types: Cigarettes  . Smokeless tobacco: Current User    Types: Chew  . Alcohol Use: Yes  . Drug Use: Yes    Special: Marijuana  . Sexual Activity: Not Currently   Other Topics Concern  . None   Social History Narrative   Additional Social History:   Sleep: has been improving   Appetite:  Good  Current Medications: Current Facility-Administered Medications  Medication Dose Route Frequency Provider Last Rate Last Dose  . 0.9 %  sodium chloride infusion   Intravenous Continuous Lajean Saver, MD 10 mL/hr at 06/29/16 2310 1,000 mL at 06/29/16 2310  . acetaminophen (TYLENOL) tablet 650 mg  650 mg Oral Q6H PRN Nanci Pina, FNP   650 mg at 06/30/16 1441  . albuterol (PROVENTIL HFA;VENTOLIN HFA) 108 (90 Base) MCG/ACT inhaler 1 puff  1 puff Inhalation Q6H PRN Myer Peer Holle Sprick, MD      . alum & mag hydroxide-simeth (MAALOX/MYLANTA) 200-200-20 MG/5ML suspension 30 mL  30 mL Oral Q4H PRN Nanci Pina, FNP      . ARIPiprazole (ABILIFY) tablet 2 mg  2 mg Oral QHS Encarnacion Slates, NP   2 mg at 06/30/16 2236  .  cloNIDine (CATAPRES) tablet 0.1 mg  0.1 mg Oral TID PRN Derrill Center, NP      . FLUoxetine (PROZAC) capsule 20 mg  20 mg Oral Daily Jenne Campus, MD   20 mg at 07/01/16 0829  . gabapentin (NEURONTIN) capsule 400 mg  400 mg Oral TID Jenne Campus, MD   400 mg at 07/01/16 0829  . hydrOXYzine (ATARAX/VISTARIL) tablet 25 mg  25 mg Oral Q6H PRN Nanci Pina, FNP   25 mg at 07/01/16 0835  . lisinopril (PRINIVIL,ZESTRIL) tablet 10 mg  10 mg Oral Daily Derrill Center, NP   10 mg at 07/01/16 0829  . loratadine (CLARITIN) tablet 10 mg  10 mg Oral Daily Kerrie Buffalo, NP   10 mg at 07/01/16 0829  . magnesium  hydroxide (MILK OF MAGNESIA) suspension 30 mL  30 mL Oral Daily PRN Nanci Pina, FNP      . mometasone-formoterol (DULERA) 100-5 MCG/ACT inhaler 2 puff  2 puff Inhalation BID Jenne Campus, MD   2 puff at 07/01/16 0829  . neomycin-bacitracin-polymyxin (NEOSPORIN) ointment   Topical PRN Encarnacion Slates, NP      . nicotine (NICODERM CQ - dosed in mg/24 hours) patch 21 mg  21 mg Transdermal Daily Nanci Pina, FNP   21 mg at 07/01/16 0829  . oxyCODONE (Oxy IR/ROXICODONE) immediate release tablet 5 mg  5 mg Oral BID PRN Kerrie Buffalo, NP   5 mg at 07/01/16 9381  . prazosin (MINIPRESS) capsule 1 mg  1 mg Oral QHS Encarnacion Slates, NP   1 mg at 06/30/16 2236  . topiramate (TOPAMAX) tablet 25 mg  25 mg Oral Daily Jenne Campus, MD   25 mg at 07/01/16 0829  . traZODone (DESYREL) tablet 100 mg  100 mg Oral QHS Jenne Campus, MD   100 mg at 06/30/16 2236    Lab Results:  No results found for this or any previous visit (from the past 91 hour(s)).  Blood Alcohol level:  No results found for: [facility]  Metabolic Disorder Labs: Lab Results  Component Value Date   HGBA1C 5.5 06/27/2016   MPG 111 06/27/2016   Lab Results  Component Value Date   PROLACTIN 16.6* 06/28/2016   Lab Results  Component Value Date   CHOL 140 06/28/2016   TRIG 73 06/28/2016   HDL 65 06/28/2016   CHOLHDL 2.2 06/28/2016   VLDL 15 06/28/2016   LDLCALC 60 06/28/2016    Physical Findings: AIMS: Facial and Oral Movements Muscles of Facial Expression: None, normal Lips and Perioral Area: None, normal Jaw: None, normal Tongue: None, normal,Extremity Movements Upper (arms, wrists, hands, fingers): None, normal Lower (legs, knees, ankles, toes): None, normal, Trunk Movements Neck, shoulders, hips: None, normal, Overall Severity Severity of abnormal movements (highest score from questions above): None, normal Incapacitation due to abnormal movements: None, normal Patient's awareness of abnormal movements (rate only  patient's report): No Awareness, Dental Status Current problems with teeth and/or dentures?: No Does patient usually wear dentures?: No  CIWA:  CIWA-Ar Total: 2 COWS:  COWS Total Score: 2  Musculoskeletal: Strength & Muscle Tone: within normal limits Gait & Station: normal Patient leans: N/A  Psychiatric Specialty Exam: Physical Exam  Nursing note and vitals reviewed. Constitutional: He is oriented to person, place, and time. He appears well-developed.  HENT:  Head: Normocephalic.  Cardiovascular: Normal rate.   Musculoskeletal: Normal range of motion.  Neurological: He is alert and oriented to person,  place, and time.  Psychiatric: He has a normal mood and affect. His behavior is normal.    Review of Systems  Psychiatric/Behavioral: Positive for depression. Negative for suicidal ideas, hallucinations and substance abuse. The patient is nervous/anxious.   All other systems reviewed and are negative. increased flank pain today, describes as severe, no hematuria  Blood pressure 126/80, pulse 100, temperature 98.1 F (36.7 C), temperature source Oral, resp. rate 16, height 5' 7.72" (1.72 m), weight 134 lb (60.782 kg), SpO2 99 %.Body mass index is 20.55 kg/(m^2).  General Appearance: improved grooming   Eye Contact:  Good  Speech:  Clear and Coherent  Volume:  Normal  Mood: reports increased depression today   Affect:  Anxious ,  constricted   Thought Process:  Linear  Orientation:  Full (Time, Place, and Person)  Thought Content: at this time no hallucinations, no delusions, not internally preoccupied   Suicidal Thoughts:  No denies suicidal ideations, denies any self injurious ideations - contracts for safety on the unit   Homicidal Thoughts:  No denies any homicidal or violent ideations   Memory:  Recent and remote grossly intact   Judgement:  Improving   Insight:  Improving   Psychomotor Activity: restless , uncomfortable in pain  Concentration:  Concentration: Good and  Attention Span: Good  Recall:  Good  Fund of Knowledge:  Good  Language:  Good  Akathisia:  No  Handed:  Right  AIMS (if indicated):     Assets:  Communication Skills Desire for Improvement Resilience Social Support  ADL's:  Intact  Cognition:  WNL  Sleep:  Number of Hours: 6   Assessment -  Patient reports worsening depression today related to increased, acute pain and anniversary of deceased family member's birthday. Presets depressed, constricted in affect, denies suicidal ideations and no psychotic symptoms. Patient thinks his pain is related to a kidney stone/urolithiasis, and does have confirmed history of nephrolithiasis, he denies hematuria but does describe decreased urinary output today. No medication side effects  Treatment Plan Summary: Encourage group and milieu participation to work on coping skills and symptom reduction Continue Prozac  20 mgrs Q DAY  for depression, anxiety   Continue  Abilify 2 mg QDAY  for mood instability/adjunct treatment for depression.  Continue Minipress 1 mg QHS for  PTSD symptoms. Continue Trazodone to 100  mgrs QHS for insomnia Continue NEURONTIN  400 mgrs TID for anxiety and chronic pain, side effects reviewed  Continue  TOPAMAX 25 mgrs QDAY for migraine, headache prophylaxis, may also help mood , side effects discussed  Added Oxy IR 5 mg BID PRN for flank pain/nephrolithiasis Patient transferred to ED for management of acute severe pain and urological management if needed- ED physician aware- have discussed case with her .  Neita Garnet, MD  07/01/2016, 1:12 PM

## 2016-07-01 NOTE — ED Provider Notes (Signed)
CSN: [id number]     Arrival date & time 06/29/16  2145 History   First MD Initiated Contact with Patient 06/29/16 2204     Chief Complaint  Patient presents with  . Referral  . Flank Pain     (Consider location/radiation/quality/duration/timing/severity/associated sxs/prior Treatment) HPI   30 year old male with hx of kidney stone and psychiatric illness brought here via EMS for evaluation of R flank pain.  Pt have been at [facility] for psychiatric rehab for the past 8 days.  For the past 3 days he has had persistent R flank pain.  Described pain is severe, stabbing, radiates to his abdomen.  Pain worsen with movement and sts when urinate he only have trickles of urine.  He denies fever, chills, lightheadedness, dizziness, cp, sob, burning on urination or hematuria.  Pt sts his psychiatrist sent him here for IVF and pain control.  He also admits being seen 3 days ago for same.  At that time, a renal stone study CT scan demonstrates no evidence of nephrolithiasis on the L kidney but no hydronephrosis. UA at that time without evidence of UTI.  He did received pain management during his visit.   Past Medical History  Diagnosis Date  . Asthma   . Anxiety    History reviewed. No pertinent past surgical history. History reviewed. No pertinent family history. Social History  Substance Use Topics  . Smoking status: Current Every Day Smoker -- 1.00 packs/day    Types: Cigarettes  . Smokeless tobacco: Current User    Types: Chew  . Alcohol Use: Yes    Review of Systems  All other systems reviewed and are negative.     Allergies  Review of patient's allergies indicates no known allergies.  Home Medications   Prior to Admission medications   Not on File   BP 126/80 mmHg  Pulse 100  Temp(Src) 98.1 F (36.7 C) (Oral)  Resp 16  Ht 5' 7.72" (1.72 m)  Wt 60.782 kg  BMI 20.55 kg/m2  SpO2 99% Physical Exam  Constitutional: He appears well-developed and well-nourished. No distress.   Caucasian male laying in bed, appears comfortable.    HENT:  Head: Atraumatic.  Eyes: Conjunctivae are normal.  Neck: Neck supple.  Cardiovascular: Normal rate and regular rhythm.   Pulmonary/Chest: Breath sounds normal.  Abdominal: Soft. There is no tenderness.  Genitourinary: Penis normal. No penile tenderness.  R CVA tenderness on percussion.  No overlying skin changes.   Neurological: He is alert.  Skin: No rash noted.  Psychiatric: He has a normal mood and affect.  Nursing note and vitals reviewed.   ED Course  Procedures (including critical care time) Labs Review Labs Reviewed  URINALYSIS W MICROSCOPIC (NOT AT [facility]) - Abnormal; Notable for the following:    Leukocytes, UA SMALL (*)    Bacteria, UA RARE (*)    Squamous Epithelial / LPF 0-5 (*)    Crystals CA OXALATE CRYSTALS (*)    All other components within normal limits  TSH - Abnormal; Notable for the following:    TSH 0.333 (*)    All other components within normal limits  PROLACTIN - Abnormal; Notable for the following:    Prolactin 16.6 (*)    All other components within normal limits  BASIC METABOLIC PANEL  CBC WITH DIFFERENTIAL/PLATELET  T4, FREE  HEMOGLOBIN A1C  LIPID PANEL    Imaging Review Ct Renal Stone Study  06/30/2016  CLINICAL DATA:  Right flank pain EXAM: CT ABDOMEN  AND PELVIS WITHOUT CONTRAST TECHNIQUE: Multidetector CT imaging of the abdomen and pelvis was performed following the standard protocol without IV contrast. COMPARISON:  06/03/2014 FINDINGS: Lower chest and abdominal wall:  No contributory findings. Hepatobiliary: No focal liver abnormality.No evidence of biliary obstruction or stone. Pancreas: Unremarkable. Spleen: Unremarkable. Adrenals/Urinary Tract: Negative adrenals. Two neighboring calcifications in the interpolar left kidney measuring 4 mm each on coronal reformats. No hydronephrosis or ureteral calculus. Presumed 12 mm left lower pole cyst. Unremarkable bladder. Stomach/Bowel:  No  obstruction. No appendicitis. Reproductive:No pathologic findings. Vascular/Lymphatic: No acute vascular abnormality. No mass or adenopathy. Other: No ascites or pneumoperitoneum. Musculoskeletal: No acute abnormalities. IMPRESSION: 1. No acute finding.  No hydronephrosis or ureteral calculus. 2. Left nephrolithiasis. Electronically Signed   By: Marnee SpringJonathon  Watts M.D.   On: 06/30/2016 00:09   I have personally reviewed and evaluated these images and lab results as part of my medical decision-making.   EKG Interpretation None      MDM   Final diagnoses:  Right flank pain  UTI (lower urinary tract infection)   BP 126/80 mmHg  Pulse 100  Temp(Src) 98.1 F (36.7 C) (Oral)  Resp 16  Ht 5' 7.72" (1.72 m)  Wt 60.782 kg  BMI 20.55 kg/m2  SpO2 99%   3:35 PM Pt report having R flank pain and attributed to kidney stone.  He was evaluated for same 2 days ago.  His renal stone study at that time did not show evidence of obstructive kidney stone.  Therefore, i have low suspicion that his R flank pain is related to a kidney stone.  Will check UA to r/o UTI, although my suspicion is low.  Toradol and IVF given.  Doubt appendicitis, colitis, GU pathology or other emergent medical condition causing his pain.  He is afebrile with stable normal vital sign.    4:50 PM UA shows many bacteria without WBC or RBC.  Since pt did report dysuria, will treatment for suspect UTI with doxy.  GC/Ch cultures sent.  Pt discharge back to facility.    Fayrene HelperBowie Avira Tillison, PA-C 07/01/16 1651  Marily MemosJason Mesner, MD 07/01/16 [phone]

## 2016-07-01 NOTE — Progress Notes (Signed)
Patient transported [facility] at [facility] via EMS and [facility].

## 2016-07-01 NOTE — Tx Team (Signed)
Interdisciplinary Treatment Plan Update (Adult)  Date:  07/01/2016  Time Reviewed:  1:08 PM   Progress in Treatment: Attending groups: Yes Participating in groups:  Yes Taking medication as prescribed:  Yes. Tolerating medication:  Yes. Family/Significant othe contact made:  SPE completed with pt's ex fiance.  Patient understands diagnosis:  Yes. and As evidenced by:  seeking treatment for SI, depression, SI attempt, and for medication stabilization. Discussing patient identified problems/goals with staff:  Yes. Medical problems stabilized or resolved:  Yes. Denies suicidal/homicidal ideation: Yes. Issues/concerns per patient self-inventory:  Other:  Discharge Plan or Barriers: Pt referred to [facility] for mental health follow-up. He plans to return home at discharge. Pt also given [employer] information/pamphlet. Pt is discharging Saturday and will have a friend pick him up after lunch.   Reason for Continuation of Hospitalization: Medication management   Comments:  30 year old male who presents IVC, in no acute distress, for the treatment of SI and Depression. Patient reports "yesterday I placed a gun in my mouth but the bullets wouldn't fire". Patient had complaints of "tiredness". Patient presents with passive SI and contracts for safety upon admission. Patient reports AVH stating "I hear people talking and see people". Per report, patient called 911 stating that he wanted to kill himself. Per report, patient held gun to his head and pointed it towards his brother. Lonna Duval was called, patient pointed gun at [facility] team and then eventually agreed to be taken to hospital. On admission, patient reports that he is unable to recall most of the incident leading up to admission. 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 Patient reports smoking a  pack of cigarettes daily and "dipping" a can of smokeless tobacco daily. Patient reports daily marijuana use. While at [facility], patient would like to work on "my life back to where it was" and "Getting family back together". At end of admission patient stated "I really want help and I'm hoping I can get the help I need here".  Estimated length of stay:  1 day. Pt scheduled for Saturday discharge.   Additional Comments:  Patient and CSW reviewed pt's identified goals and treatment plan. Patient verbalized understanding and agreed to treatment plan. CSW reviewed [facility] "Discharge Process and Patient Involvement" Form. Pt verbalized understanding of information provided and signed form.    Review of initial/current patient goals per problem list:  1. Goal(s): Patient will participate in aftercare plan  Met: Yes  Target date: at discharge  As evidenced by: Patient will participate within aftercare plan AEB aftercare provider and housing plan at discharge being identified.  7/14: CSW assessing for appropriate referrals.   7/19: Pt plans to return home; follow-up at [facility] (appt scheduled for Monday 07/04/16).   2. Goal (s): Patient will exhibit decreased depressive symptoms and suicidal ideations.  Met: Adequate for discharge per Dr. Parke Poisson.    Target date: at discharge  As evidenced by: Patient will utilize self rating of depression at 3 or below and demonstrate decreased signs of depression or be deemed stable for discharge by MD.  7/14: Pt rates depression as high. Denies SI/HI/AVH this morning and is able to contract for safety on the unit.   7/19: Pt rates depression as 8/10 and presents with depressed mood/flat affect. Pt brightens when talking with staff and other patients in a non-group setting. Mood incongruent.  7/21: Pt rates depression/anxiety as "lower." He continues to complain of physical pain and is passing kidney stones. He appears to be nearing his baseline.    Attendees: Patient:   07/01/2016 1:08 PM   Family:   07/01/2016 1:08 PM   Physician:  Dr. Parke Poisson MD 07/01/2016 1:08 PM   Nursing: Reita May RN  07/01/2016 1:08 PM   Clinical Social Worker: Maxie Better, LCSW 07/01/2016 1:08 PM   Clinical Social Worker: Erasmo Downer Drinkard LCSW; Peri Maris LCSWA 07/01/2016 1:08 PM   Other:  Gerline Legacy Nurse Case Manager 07/01/2016 1:08 PM   Other:  Agustina Caroli NP ; May Augustin NP 07/01/2016 1:08 PM   Other:   07/01/2016 1:08 PM   Other:  07/01/2016 1:08 PM   Other:  07/01/2016 1:08 PM   Other:  07/01/2016 1:08 PM    07/01/2016 1:08 PM    07/01/2016 1:08 PM    07/01/2016 1:08 PM    07/01/2016 1:08 PM    Scribe for Treatment Team:   Maxie Better, LCSW 07/01/2016 1:08 PM

## 2016-07-01 NOTE — ED Notes (Signed)
Per EMS-right flank pain-history of kidney stones-urinary hesitancy-50 mg of Fentanyl given in route

## 2016-07-02 DIAGNOSIS — F333 Major depressive disorder, recurrent, severe with psychotic symptoms: Secondary | ICD-10-CM | POA: Diagnosis not present

## 2016-07-02 MED ORDER — TOPIRAMATE 25 MG PO TABS: 25.0000 mg | ORAL_TABLET | Freq: Every day | ORAL | Status: DC

## 2016-07-02 MED ORDER — TRAZODONE HCL 100 MG PO TABS: 100.0000 mg | ORAL_TABLET | Freq: Every day | ORAL | Status: DC

## 2016-07-02 MED ORDER — FLUOXETINE HCL 20 MG PO CAPS: 20.0000 mg | ORAL_CAPSULE | Freq: Every day | ORAL | Status: DC

## 2016-07-02 MED ORDER — GABAPENTIN 400 MG PO CAPS: 400.0000 mg | ORAL_CAPSULE | Freq: Three times a day (TID) | ORAL | Status: DC

## 2016-07-02 MED ORDER — PRAZOSIN HCL 1 MG PO CAPS: 1.0000 mg | ORAL_CAPSULE | Freq: Every day | ORAL | Status: DC

## 2016-07-02 MED ORDER — HYDROXYZINE HCL 50 MG PO TABS
ORAL_TABLET | ORAL | Status: AC
Start: 2016-07-02 — End: 2016-07-02
  Filled 2016-07-02: qty 1

## 2016-07-02 MED ORDER — MOMETASONE FURO-FORMOTEROL FUM 100-5 MCG/ACT IN AERO: 2.0000 | INHALATION_SPRAY | Freq: Two times a day (BID) | RESPIRATORY_TRACT | Status: DC

## 2016-07-02 MED ORDER — NICOTINE 21 MG/24HR TD PT24: 21.0000 mg | MEDICATED_PATCH | Freq: Every day | TRANSDERMAL | Status: DC

## 2016-07-02 MED ORDER — HYDROXYZINE HCL 50 MG PO TABS
50.0000 mg | ORAL_TABLET | Freq: Four times a day (QID) | ORAL | Status: DC | PRN
  Administered 2016-07-02: 50 mg via ORAL
  Filled 2016-07-02: qty 10

## 2016-07-02 MED ORDER — LISINOPRIL 10 MG PO TABS: 10.0000 mg | ORAL_TABLET | Freq: Every day | ORAL | Status: DC

## 2016-07-02 MED ORDER — HYDROXYZINE HCL 50 MG PO TABS: 50.0000 mg | ORAL_TABLET | Freq: Four times a day (QID) | ORAL | Status: DC | PRN

## 2016-07-02 MED ORDER — ARIPIPRAZOLE 2 MG PO TABS: 2.0000 mg | ORAL_TABLET | Freq: Every day | ORAL | Status: DC
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
## 2016-07-02 NOTE — Progress Notes (Signed)
D: Patient's self inventory sheet: patient has fair sleep, requested and recieved sleep medication.fair  Appetite, normal energy level, poor concentration. Rated depression 10/10, hopeless 7/10, anxiety 7/10. SI/HI/AVH: Denies. Physical complaints are headaches and back pain, patient has dx of kidney stone and UTI. . Goal is "Just to get through today". Plans to work on "Try talking sitting with people". Pt was informed that he was being discharged today instead of Monday as he had expected and became very upset and anxious. NP ordered Vistaril 50mg  for his agitation. Reviewed discharge plans with patient, he has a plan to go [facility] Monday morning, will be discharged with prescriptions and medication samples.  A: Medications administered, assessed medication knowledge and education given on medication regimen.  Emotional support and encouragement given patient. R: Denies SI and HI , contracts for safety. Safety maintained with 15 minute checks.
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
## 2016-07-02 NOTE — Progress Notes (Signed)
Patient to discharged ambulatory to lobby. All discharge paperwork given and signed, valuables returned. Prescriptions and sample medications, inhaler given. Patient able to verbalize understanding. Patient stable, denies SI/HI/AVH. Patient given opportunity to express concerns and ask questions.

## 2016-07-02 NOTE — [facility] Group Notes (Signed)
[facility] Group Notes: (Clinical Social Work)   07/02/2016      Type of Therapy:  Group Therapy   Participation Level:  Did Not Attend despite MHT prompting   Ambrose Mantle, LCSW 07/02/2016, 12:57 PM

## 2016-07-04 LAB — GC/CHLAMYDIA PROBE AMP (~~LOC~~) NOT AT ARMC

## 2016-07-04 NOTE — ED Notes (Signed)
Contacted by cytology.  Unable to complete urine Chlamydia and gonorrhea.  Not enough sample.

## 2016-10-08 ENCOUNTER — Ambulatory Visit (HOSPITAL_COMMUNITY)
Admission: RE | Admit: 2016-10-08 | Payer: No Typology Code available for payment source | Source: Home / Self Care | Admitting: Psychiatry

## 2017-07-12 ENCOUNTER — Inpatient Hospital Stay (HOSPITAL_COMMUNITY)
Admission: AD | Admit: 2017-07-12 | Discharge: 2017-07-18 | DRG: 885 | Disposition: A | Discharge status: Home / Self Care | Payer: Federal, State, Local not specified - Other | Attending: Emergency Medicine | Admitting: Emergency Medicine

## 2017-07-12 ENCOUNTER — Encounter (HOSPITAL_COMMUNITY): Payer: Self-pay

## 2017-07-12 DIAGNOSIS — Z634 Disappearance and death of family member: Secondary | ICD-10-CM | POA: Diagnosis not present

## 2017-07-12 DIAGNOSIS — F3341 Major depressive disorder, recurrent, in partial remission: Secondary | ICD-10-CM

## 2017-07-12 DIAGNOSIS — F515 Nightmare disorder: Secondary | ICD-10-CM | POA: Diagnosis not present

## 2017-07-12 DIAGNOSIS — F17210 Nicotine dependence, cigarettes, uncomplicated: Secondary | ICD-10-CM | POA: Diagnosis present

## 2017-07-12 DIAGNOSIS — Z7951 Long term (current) use of inhaled steroids: Secondary | ICD-10-CM | POA: Diagnosis not present

## 2017-07-12 DIAGNOSIS — F1290 Cannabis use, unspecified, uncomplicated: Secondary | ICD-10-CM | POA: Diagnosis not present

## 2017-07-12 DIAGNOSIS — M2550 Pain in unspecified joint: Secondary | ICD-10-CM | POA: Diagnosis not present

## 2017-07-12 DIAGNOSIS — J45909 Unspecified asthma, uncomplicated: Secondary | ICD-10-CM | POA: Diagnosis present

## 2017-07-12 DIAGNOSIS — F1210 Cannabis abuse, uncomplicated: Secondary | ICD-10-CM | POA: Diagnosis present

## 2017-07-12 DIAGNOSIS — Z4689 Encounter for fitting and adjustment of other specified devices: Secondary | ICD-10-CM

## 2017-07-12 DIAGNOSIS — F39 Unspecified mood [affective] disorder: Secondary | ICD-10-CM | POA: Diagnosis not present

## 2017-07-12 DIAGNOSIS — F333 Major depressive disorder, recurrent, severe with psychotic symptoms: Secondary | ICD-10-CM | POA: Diagnosis not present

## 2017-07-12 DIAGNOSIS — F1010 Alcohol abuse, uncomplicated: Secondary | ICD-10-CM | POA: Diagnosis present

## 2017-07-12 DIAGNOSIS — F4310 Post-traumatic stress disorder, unspecified: Secondary | ICD-10-CM | POA: Diagnosis present

## 2017-07-12 DIAGNOSIS — F323 Major depressive disorder, single episode, severe with psychotic features: Principal | ICD-10-CM | POA: Diagnosis present

## 2017-07-12 DIAGNOSIS — F251 Schizoaffective disorder, depressive type: Secondary | ICD-10-CM | POA: Diagnosis not present

## 2017-07-12 DIAGNOSIS — F419 Anxiety disorder, unspecified: Secondary | ICD-10-CM | POA: Diagnosis not present

## 2017-07-12 DIAGNOSIS — G4700 Insomnia, unspecified: Secondary | ICD-10-CM | POA: Diagnosis not present

## 2017-07-12 DIAGNOSIS — Z599 Problem related to housing and economic circumstances, unspecified: Secondary | ICD-10-CM

## 2017-07-12 DIAGNOSIS — I10 Essential (primary) hypertension: Secondary | ICD-10-CM | POA: Diagnosis present

## 2017-07-12 DIAGNOSIS — F259 Schizoaffective disorder, unspecified: Secondary | ICD-10-CM | POA: Diagnosis present

## 2017-07-12 DIAGNOSIS — F1410 Cocaine abuse, uncomplicated: Secondary | ICD-10-CM | POA: Diagnosis present

## 2017-07-12 DIAGNOSIS — R45851 Suicidal ideations: Secondary | ICD-10-CM | POA: Diagnosis present

## 2017-07-12 DIAGNOSIS — F322 Major depressive disorder, single episode, severe without psychotic features: Secondary | ICD-10-CM | POA: Diagnosis not present

## 2017-07-12 DIAGNOSIS — F329 Major depressive disorder, single episode, unspecified: Secondary | ICD-10-CM | POA: Diagnosis present

## 2017-07-12 LAB — URINALYSIS, ROUTINE W REFLEX MICROSCOPIC
Bacteria, UA: NONE SEEN
Bilirubin Urine: NEGATIVE
GLUCOSE, UA: NEGATIVE mg/dL
HGB URINE DIPSTICK: NEGATIVE
Ketones, ur: NEGATIVE mg/dL
Nitrite: NEGATIVE
PROTEIN: NEGATIVE mg/dL
SPECIFIC GRAVITY, URINE: 1.004 — AB (ref 1.005–1.030)
Squamous Epithelial / LPF: NONE SEEN
pH: 8 (ref 5.0–8.0)

## 2017-07-12 LAB — RAPID URINE DRUG SCREEN, HOSP PERFORMED
AMPHETAMINES: NOT DETECTED
BARBITURATES: NOT DETECTED
Benzodiazepines: NOT DETECTED
Cocaine: NOT DETECTED
Opiates: NOT DETECTED
TETRAHYDROCANNABINOL: POSITIVE — AB

## 2017-07-12 MED ORDER — ACETAMINOPHEN 325 MG PO TABS
650.0000 mg | ORAL_TABLET | Freq: Four times a day (QID) | ORAL | Status: DC | PRN
  Administered 2017-07-15 – 2017-07-17 (×3): 650 mg via ORAL
  Filled 2017-07-12 (×3): qty 2

## 2017-07-12 MED ORDER — ARIPIPRAZOLE 2 MG PO TABS
2.0000 mg | ORAL_TABLET | Freq: Once | ORAL | Status: AC
  Administered 2017-07-12: 2 mg via ORAL
  Filled 2017-07-12 (×2): qty 1

## 2017-07-12 MED ORDER — MAGNESIUM HYDROXIDE 400 MG/5ML PO SUSP: 30.0000 mL | Freq: Every day | ORAL | Status: DC | PRN

## 2017-07-12 MED ORDER — NICOTINE 21 MG/24HR TD PT24
21.0000 mg | MEDICATED_PATCH | Freq: Every day | TRANSDERMAL | Status: DC
  Administered 2017-07-12 – 2017-07-18 (×7): 21 mg via TRANSDERMAL
  Filled 2017-07-12 (×11): qty 1

## 2017-07-12 MED ORDER — LISINOPRIL 10 MG PO TABS
10.0000 mg | ORAL_TABLET | Freq: Every day | ORAL | Status: DC
  Administered 2017-07-12 – 2017-07-18 (×7): 10 mg via ORAL
  Filled 2017-07-12 (×6): qty 1
  Filled 2017-07-12: qty 2
  Filled 2017-07-12 (×4): qty 1

## 2017-07-12 MED ORDER — HYDROXYZINE HCL 50 MG PO TABS
50.0000 mg | ORAL_TABLET | Freq: Four times a day (QID) | ORAL | Status: DC | PRN
  Administered 2017-07-12 – 2017-07-17 (×14): 50 mg via ORAL
  Filled 2017-07-12 (×13): qty 1
  Filled 2017-07-12: qty 10

## 2017-07-12 MED ORDER — TRAZODONE HCL 50 MG PO TABS
50.0000 mg | ORAL_TABLET | Freq: Every evening | ORAL | Status: DC | PRN
  Administered 2017-07-12 – 2017-07-17 (×6): 50 mg via ORAL
  Filled 2017-07-12: qty 5
  Filled 2017-07-12 (×6): qty 1

## 2017-07-12 MED ORDER — ALUM & MAG HYDROXIDE-SIMETH 200-200-20 MG/5ML PO SUSP: 30.0000 mL | ORAL | Status: DC | PRN

## 2017-07-12 MED ORDER — GABAPENTIN 100 MG PO CAPS
200.0000 mg | ORAL_CAPSULE | Freq: Three times a day (TID) | ORAL | Status: DC
  Administered 2017-07-12 – 2017-07-13 (×2): 200 mg via ORAL
  Filled 2017-07-12 (×7): qty 2

## 2017-07-12 MED ORDER — IBUPROFEN 800 MG PO TABS
800.0000 mg | ORAL_TABLET | Freq: Three times a day (TID) | ORAL | Status: DC | PRN
  Administered 2017-07-12 – 2017-07-18 (×9): 800 mg via ORAL
  Filled 2017-07-12 (×9): qty 1

## 2017-07-12 MED ORDER — HYDROXYZINE HCL 50 MG PO TABS
ORAL_TABLET | ORAL | Status: AC
  Filled 2017-07-12: qty 1

## 2017-07-12 NOTE — H&P (Signed)
Behavioral Health Medical Screening Exam  Edward Stone is an 31 y.o. male. Patient presents with suicidal ideation and auditory hallucinations. Patient reports substance abuse history.  Edward Stone is awake, alert and oriented. Reports he was prescribed medications in the past and hasn't follow-ed up with medications. Support, encouragement and reassurance was provided.   Total Time spent with patient: 30 minutes  Psychiatric Specialty Exam: Physical Exam  Vitals reviewed. Constitutional: He is oriented to person, place, and time. He appears well-developed.  Neurological: He is alert and oriented to person, place, and time.  Psychiatric: He has a normal mood and affect. His behavior is normal.    Review of Systems  Psychiatric/Behavioral: Positive for depression and suicidal ideas. The patient is nervous/anxious.     There were no vitals taken for this visit.There is no height or weight on file to calculate BMI.  General Appearance: Disheveled  Eye Contact:  Fair  Speech:  Clear and Coherent  Volume:  Normal  Mood:  Anxious, Depressed and Dysphoric  Affect:  Depressed and Flat  Thought Process:  Coherent  Orientation:  Full (Time, Place, and Person)  Thought Content:  Hallucinations: Auditory and Rumination  Suicidal Thoughts:  Yes.  with intent/plan  Homicidal Thoughts:  No  Memory:  Immediate;   Fair Recent;   Fair Remote;   Fair  Judgement:  Fair  Insight:  Fair  Psychomotor Activity:  Restlessness  Concentration: Concentration: Fair  Recall:  [employer]  Fund of Knowledge:Fair  Language: Good  Akathisia:  NA  Handed:  Right  AIMS (if indicated):     Assets:  Communication Skills Desire for Improvement Resilience Social Support  Sleep:       Musculoskeletal: Strength & Muscle Tone: within normal limits Gait & Station: normal Patient leans: N/A  There were no vitals taken for this visit. B/P 130/91 HR 90 TEMP 97.7 RR18 Recommendations:  Based on my  evaluation the patient does not appear to have an emergency medical condition.  -Labs and baseline EKG to be collected on unit   Oneta Rackanika N Jed Kutch, NP 07/12/2017, 1:05 PM
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
## 2017-07-12 NOTE — Progress Notes (Signed)
Edward Stone is a 31 year old male being admitted voluntarily to [facility]  He came with with suicidal ideation with a plan to cut his wrists.  He reported auditory hallucinations telling him to kill himself.  He has been using cocaine and marijuana daily.  Recent stressors was a fall off of a room causing broken left arm and having metal plates put into his head.  He continues to voice suicidal ideation but is able to contract for safety on the unit.  He is diagnosed with Major Depressive Disorder with psychotic features.  Oriented him to the unit.  Admission paperwork completed and signed.  Belongings searched and secured in locker # 42.  Skin assessment completed and noted broken left arm(cast intact) and scar upper.  Q 15 minute checks initiated for safety.  We will monitor the progress towards his goals.

## 2017-07-12 NOTE — Progress Notes (Signed)
D   Pt is irritable and anxious   He requested vistaril for anxiety and wanted something to detox him off of cocaine    He claims he is a daily user but his UDS is negative for cocaine but positive for pot   He is intrusive and medication seeking as he requested narcotic pain medication earlier    He has been visible on the milieu playing cards in the dayroom with peers A    Verbal support given   Medications administered and effectiveness monitored   Consult with PA for medication orders for anxiety    Q 15 min checks R    Pt is safe at present time

## 2017-07-12 NOTE — Tx Team (Signed)
Initial Treatment Plan 07/12/2017 6:06 PM [facility]Roy Stone [name] ZOX:[id number]    PATIENT STRESSORS: Financial difficulties Occupational concerns Substance abuse Traumatic event   PATIENT STRENGTHS: Wellsite geologistCommunication skills General fund of knowledge Motivation for treatment/growth   PATIENT IDENTIFIED PROBLEMS: Depression  Suicidal ideation  Substance abuse  Psychosis  "Get better"  "See the outcome of this to be successful"           DISCHARGE CRITERIA:  Improved stabilization in mood, thinking, and/or behavior Verbal commitment to aftercare and medication compliance Withdrawal symptoms are absent or subacute and managed without 24-hour nursing intervention  PRELIMINARY DISCHARGE PLAN: Outpatient therapy Medication management  PATIENT/FAMILY INVOLVEMENT: This treatment plan has been presented to and reviewed with the patient, [facility].  The patient and family have been given the opportunity to ask questions and make suggestions.  Levin BaconHeather V Tanuj Mullens, RN 07/12/2017, 6:06 PM

## 2017-07-13 DIAGNOSIS — F259 Schizoaffective disorder, unspecified: Secondary | ICD-10-CM | POA: Diagnosis present

## 2017-07-13 DIAGNOSIS — F39 Unspecified mood [affective] disorder: Secondary | ICD-10-CM

## 2017-07-13 DIAGNOSIS — M2550 Pain in unspecified joint: Secondary | ICD-10-CM

## 2017-07-13 DIAGNOSIS — F419 Anxiety disorder, unspecified: Secondary | ICD-10-CM

## 2017-07-13 DIAGNOSIS — R45851 Suicidal ideations: Secondary | ICD-10-CM

## 2017-07-13 DIAGNOSIS — F333 Major depressive disorder, recurrent, severe with psychotic symptoms: Secondary | ICD-10-CM

## 2017-07-13 MED ORDER — FLUOXETINE HCL 10 MG PO CAPS
10.0000 mg | ORAL_CAPSULE | Freq: Every day | ORAL | Status: DC
  Administered 2017-07-13 – 2017-07-14 (×2): 10 mg via ORAL
  Filled 2017-07-13 (×4): qty 1

## 2017-07-13 MED ORDER — PRAZOSIN HCL 1 MG PO CAPS
1.0000 mg | ORAL_CAPSULE | Freq: Every day | ORAL | Status: DC
  Administered 2017-07-13: 1 mg via ORAL
  Filled 2017-07-13 (×2): qty 1

## 2017-07-13 MED ORDER — MIRTAZAPINE 15 MG PO TBDP: 15.0000 mg | ORAL_TABLET | Freq: Every day | ORAL | Status: DC

## 2017-07-13 MED ORDER — GABAPENTIN 100 MG PO CAPS
100.0000 mg | ORAL_CAPSULE | Freq: Three times a day (TID) | ORAL | Status: DC
  Administered 2017-07-13 – 2017-07-14 (×3): 100 mg via ORAL
  Filled 2017-07-13 (×6): qty 1

## 2017-07-13 MED ORDER — ALBUTEROL SULFATE HFA 108 (90 BASE) MCG/ACT IN AERS
1.0000 | INHALATION_SPRAY | Freq: Four times a day (QID) | RESPIRATORY_TRACT | Status: DC | PRN
  Administered 2017-07-13 – 2017-07-17 (×5): 2 via RESPIRATORY_TRACT
  Filled 2017-07-13: qty 6.7

## 2017-07-13 MED ORDER — ARIPIPRAZOLE 10 MG PO TABS
10.0000 mg | ORAL_TABLET | Freq: Every day | ORAL | Status: DC
  Administered 2017-07-13 – 2017-07-16 (×4): 10 mg via ORAL
  Filled 2017-07-13 (×6): qty 1

## 2017-07-13 MED ORDER — MIRTAZAPINE 7.5 MG PO TABS
7.5000 mg | ORAL_TABLET | Freq: Every day | ORAL | Status: DC
  Administered 2017-07-13 – 2017-07-17 (×5): 7.5 mg via ORAL
  Filled 2017-07-13: qty 5
  Filled 2017-07-13 (×7): qty 1

## 2017-07-13 NOTE — Plan of Care (Signed)
Problem: Medication: Goal: Compliance with prescribed medication regimen will improve Outcome: Progressing Pt compliant with taking scheduled meds. Pt verbalized understanding of current med orders. Pt will discuss home meds with MD.

## 2017-07-13 NOTE — Plan of Care (Signed)
Problem: Activity: Goal: Interest or engagement in activities will improve Outcome: Progressing Pt compliant with attending scheduled groups, completing self inventory and performing ADLs.

## 2017-07-13 NOTE — Progress Notes (Signed)
D   Pt is irritable and anxious   He requested vistaril for anxiety but it was too early    He is intrusive and medication seeking as he requested narcotic pain medication earlier    He has been visible on the milieu playing cards in the dayroom with peers  He has been making inappropriate sexual remarks to his male peers and was talked to about same A    Verbal support given   Medications administered and effectiveness monitored    Q 15 min checks R    Pt is safe at present time
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
## 2017-07-13 NOTE — [facility] Group Notes (Signed)
[facility] LCSW Group Therapy 07/13/2017 1:15pm  Type of Therapy: Group Therapy- Balance in Life  Participation Level: Active   Description of the Group:  The topic for group was balance in life. Today's group focused on defining balance in one's own words, identifying things that can knock one off balance, and exploring healthy ways to maintain balance in life. Group members were asked to provide an example of a time when they felt off balance, describe how they handled that situation,and process healthier ways to regain balance in the future. Group members were asked to share the most important tool for maintaining [facility] and how they plan to apply this method after discharge.  Summary of Patient Progress Pt reports that he has been unbalanced for a while now. Pt reports that the main cause of his unbalance has been his medications. Pt is hopeful that he will get back on the correct medications while he is here in the hospital.     Therapeutic Modalities:   Cognitive Behavioral Therapy Solution-Focused Therapy Assertiveness Training   Donnelly StagerLynn Kalysta Kneisley, MSW, [facility] 07/13/2017 4:29 PM
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
## 2017-07-13 NOTE — BHH Group Notes (Signed)
The focus of this group is to educate the patient on the purpose and policies of crisis stabilization and provide a format to answer questions about their admission.  The group details unit policies and expectations of patients while admitted.  Patient did not attend 0900 nurse education orientation group this morning.  Patient stayed in room.  

## 2017-07-13 NOTE — Progress Notes (Signed)
Objective: Pt presents with an animated affect and anxious mood. Pt noted to be demanding at times regarding medications. Pt hygiene appropriate for pt. Pt observed attending scheduled groups.  Subjective: Pt rates depression 8/10. Anxiety 10/10. Pt requested Vistaril for increased anxiety level this morning. Pt endorses passive SI. Pt denies active SI and verbally contracts for safety. Pt denies a SI plan or intent. Pt stated that he has an [facility] at [facility] to have his cast replaced and assessed. Treatment team made aware. Pt requesting prn meds for anxiety too close together and stated that something will need to be done because that's not his problem.   Action: Medications reviewed with pt. Prn time frames communicated to pt. Medications administered as ordered per MD. Verbal support provided. 15 minute checks performed for safety.   Response: Pt Verbalizes understanding of med regimen but time frames will need to be reinforced. Pt compliant with treatment plan.
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
## 2017-07-14 LAB — TSH: TSH: 1.182 u[IU]/mL (ref 0.350–4.500)

## 2017-07-14 LAB — LIPID PANEL
CHOL/HDL RATIO: 2.9 ratio
CHOLESTEROL: 145 mg/dL (ref 0–200)
HDL: 50 mg/dL (ref >40)
LDL Cholesterol: 68 mg/dL (ref 0–99)
Triglycerides: 135 mg/dL (ref <150)
VLDL: 27 mg/dL (ref 0–40)

## 2017-07-14 MED ORDER — MOMETASONE FURO-FORMOTEROL FUM 100-5 MCG/ACT IN AERO
2.0000 | INHALATION_SPRAY | Freq: Two times a day (BID) | RESPIRATORY_TRACT | Status: DC
  Administered 2017-07-14 – 2017-07-18 (×8): 2 via RESPIRATORY_TRACT
  Filled 2017-07-14 (×2): qty 8.8

## 2017-07-14 MED ORDER — PRAZOSIN HCL 2 MG PO CAPS
2.0000 mg | ORAL_CAPSULE | Freq: Every day | ORAL | Status: DC
  Administered 2017-07-14 – 2017-07-17 (×4): 2 mg via ORAL
  Filled 2017-07-14: qty 2
  Filled 2017-07-14: qty 5
  Filled 2017-07-14 (×6): qty 1

## 2017-07-14 MED ORDER — GABAPENTIN 100 MG PO CAPS
200.0000 mg | ORAL_CAPSULE | Freq: Three times a day (TID) | ORAL | Status: DC
  Administered 2017-07-14 – 2017-07-18 (×13): 200 mg via ORAL
  Filled 2017-07-14 (×4): qty 2
  Filled 2017-07-14: qty 30
  Filled 2017-07-14 (×8): qty 2
  Filled 2017-07-14: qty 30
  Filled 2017-07-14 (×7): qty 2
  Filled 2017-07-14: qty 30

## 2017-07-14 MED ORDER — FLUOXETINE HCL 20 MG PO CAPS
20.0000 mg | ORAL_CAPSULE | Freq: Every day | ORAL | Status: DC
  Administered 2017-07-15: 20 mg via ORAL
  Filled 2017-07-14 (×2): qty 1

## 2017-07-14 NOTE — Progress Notes (Signed)
Nursing Progress Note 1900-0730  D) Patient presents calm and cooperative on the unit. Patient visible up in the milieu and attended group this evening. Patient denied SI/HI/AVH. Patient endorsed mild on-going pain to his left arm currently in a cast. No discoloration of hand, swelling or numbness reported. Patient complains of shortness of breath relieved by PRN inhaler stating "I have really bad asthma". [facility] stating "it works better for me, and I won't have to ask for the rescue medicine". Patient contracts for safety on the unit.  A) NP Barbara CowerJason notified of patient's request, new orders received. Emotional support given. 1:1 interaction and active listening provided. Patient medicated as prescribed. Medications and plan of care reviewed with patient. Patient verbalized understanding without further questions.  Snacks and fluids provided. Opportunities for questions or concerns presented to patient. Patient encouraged to continue to work on treatment goals. Labs, vital signs and patient behavior monitored throughout shift. Patient safety maintained with q15 min safety checks. Low fall risk precautions in place and reviewed with patient; patient verbalized understanding.  R) Patient receptive to interaction with nurse. Patient remains safe on the unit at this time. Patient denies any adverse medication reactions at this time. Patient is resting in bed without complaints. Will continue to monitor.

## 2017-07-14 NOTE — Progress Notes (Signed)
Recreation Therapy Notes   Date: 07/14/2017 Time: 9:30am Location: [address] Hall Dayroom  Group Topic: Stress Management  Goal Area(s) Addresses:  Patient will verbalize importance of using healthy stress management.  Patient will identify positive emotions associated with healthy stress management.   Behavioral Response: Engaged  Intervention: Stress Management  Activity :  Starry Night Visualization Relaxation. Recreation Therapy Intern introduced the stress management technique of visual relaxation . Recreation Therapy Intern read a script that allowed patients to take imagine being underneath a starry night sky. Recreation Therapy Intern played forest night time sounds. Patients were to follow along as script was read to engage in the activity.  Education: Stress Management, Discharge Planning.   Education Outcome: Acknowledges edcuation  Clinical Observations/Feedback: Pt attended group.  Edward Stone, Recreation Therapy Intern   Edward Stone, LRT/CTRS 

## 2017-07-14 NOTE — Tx Team (Signed)
Interdisciplinary Treatment and Diagnostic Plan Update 07/14/2017 Time of Session: 9:30am  [facility]  MRN: [id number]  Principal Diagnosis: Schizoaffective disorder [facility](HCC)  Secondary Diagnoses: Principal Problem:   Schizoaffective disorder (HCC) Active Problems:   MDD (major depressive disorder)   Current Medications:  Current Facility-Administered Medications  Medication Dose Route Frequency Provider Last Rate Last Dose  . acetaminophen (TYLENOL) tablet 650 mg  650 mg Oral Q6H PRN Oneta RackLewis, Tanika N, NP      . albuterol (PROVENTIL HFA;VENTOLIN HFA) 108 (90 Base) MCG/ACT inhaler 1-2 puff  1-2 puff Inhalation Q6H PRN Cobos, Rockey SituFernando A, MD   2 puff at 07/13/17 1713  . alum & mag hydroxide-simeth (MAALOX/MYLANTA) 200-200-20 MG/5ML suspension 30 mL  30 mL Oral Q4H PRN Oneta RackLewis, Tanika N, NP      . ARIPiprazole (ABILIFY) tablet 10 mg  10 mg Oral Daily Money, Gerlene Burdockravis B, FNP   10 mg at 07/14/17 0753  . [START ON 07/15/2017] FLUoxetine (PROZAC) capsule 20 mg  20 mg Oral Daily Cobos, Fernando A, MD      . gabapentin (NEURONTIN) capsule 200 mg  200 mg Oral TID Cobos, Fernando A, MD   200 mg at 07/14/17 1137  . hydrOXYzine (ATARAX/VISTARIL) tablet 50 mg  50 mg Oral Q6H PRN Donell SievertSimon, Spencer E, PA-C   50 mg at 07/14/17 0755  . ibuprofen (ADVIL,MOTRIN) tablet 800 mg  800 mg Oral Q8H PRN Cobos, Rockey SituFernando A, MD   800 mg at 07/14/17 0755  . lisinopril (PRINIVIL,ZESTRIL) tablet 10 mg  10 mg Oral Daily Oneta RackLewis, Tanika N, NP   10 mg at 07/14/17 0753  . magnesium hydroxide (MILK OF MAGNESIA) suspension 30 mL  30 mL Oral Daily PRN Oneta RackLewis, Tanika N, NP      . mirtazapine (REMERON) tablet 7.5 mg  7.5 mg Oral QHS Cobos, Fernando A, MD   7.5 mg at 07/13/17 2113  . nicotine (NICODERM CQ - dosed in mg/24 hours) patch 21 mg  21 mg Transdermal Daily Cobos, Rockey SituFernando A, MD   21 mg at 07/14/17 0754  . prazosin (MINIPRESS) capsule 2 mg  2 mg Oral QHS Cobos, Fernando A, MD      . traZODone (DESYREL) tablet 50 mg  50 mg Oral QHS PRN  Oneta RackLewis, Tanika N, NP   50 mg at 07/13/17 2113    PTA Medications: Prescriptions Prior to Admission  Medication Sig Dispense Refill Last Dose  . ARIPiprazole (ABILIFY) 2 MG tablet Take 1 tablet (2 mg total) by mouth at bedtime. (Patient taking differently: Take 2 mg by mouth 3 (three) times daily. ) 30 tablet 0 Past Week at Unknown time  . diclofenac (VOLTAREN) 50 MG EC tablet Take 50 mg by mouth 3 (three) times daily.   07/11/2017 at Unknown time  . FLUoxetine (PROZAC) 20 MG capsule Take 1 capsule (20 mg total) by mouth daily. (Patient taking differently: Take 40 mg by mouth daily. ) 30 capsule 0 Past Month at Unknown time  . gabapentin (NEURONTIN) 600 MG tablet Take 600 mg by mouth 3 (three) times daily.   Past Week at Unknown time  . lisinopril (PRINIVIL,ZESTRIL) 10 MG tablet Take 1 tablet (10 mg total) by mouth daily. 30 tablet 0 07/11/2017 at Unknown time  . mirtazapine (REMERON) 15 MG tablet Take 15 mg by mouth at bedtime.   Past Month at Unknown time  . oxyCODONE (OXY IR/ROXICODONE) 5 MG immediate release tablet Take 10 mg by mouth every 6 (six) hours as needed for severe pain.  07/11/2017 at Unknown time  . prazosin (MINIPRESS) 1 MG capsule Take 1 capsule (1 mg total) by mouth at bedtime. 30 capsule 0 Past Month at Unknown time  . topiramate (TOPAMAX) 25 MG tablet Take 1 tablet (25 mg total) by mouth daily. 30 tablet 0 Past Month at Unknown time  . traZODone (DESYREL) 100 MG tablet Take 1 tablet (100 mg total) by mouth at bedtime. (Patient taking differently: Take 150 mg by mouth at bedtime. ) 30 tablet 0 Past Month at Unknown time  . doxycycline (VIBRAMYCIN) 100 MG capsule Take 1 capsule (100 mg total) by mouth 2 (two) times daily. (Patient not taking: Reported on 07/12/2017) 20 capsule 0 Not Taking at Unknown time  . gabapentin (NEURONTIN) 400 MG capsule Take 1 capsule (400 mg total) by mouth 3 (three) times daily. (Patient not taking: Reported on 07/12/2017) 90 capsule 0 Not Taking at Unknown  time  . hydrOXYzine (ATARAX/VISTARIL) 50 MG tablet Take 1 tablet (50 mg total) by mouth every 6 (six) hours as needed for anxiety. (Patient not taking: Reported on 07/12/2017) 30 tablet 0 Not Taking at Unknown time  . ibuprofen (ADVIL,MOTRIN) 800 MG tablet Take 1 tablet (800 mg total) by mouth every 8 (eight) hours as needed for moderate pain. (Patient not taking: Reported on 07/12/2017) 21 tablet 0 Not Taking at Unknown time  . mometasone-formoterol (DULERA) 100-5 MCG/ACT AERO Inhale 2 puffs into the lungs 2 (two) times daily. (Patient not taking: Reported on 07/12/2017) 1 Inhaler 0 Not Taking at Unknown time  . nicotine (NICODERM CQ - DOSED IN MG/24 HOURS) 21 mg/24hr patch Place 1 patch (21 mg total) onto the skin daily. (Patient not taking: Reported on 07/12/2017) 28 patch 0 Not Taking at Unknown time    Treatment Modalities: Medication Management, Group therapy, Case management,  1 to 1 session with clinician, Psychoeducation, Recreational therapy.  Patient Stressors: Financial difficulties Occupational concerns Substance abuse Traumatic event Patient Strengths: DentistCommunication skills General fund of knowledge Motivation for treatment/growth  Physician Treatment Plan for Primary Diagnosis: Schizoaffective disorder (HCC) Long Term Goal(s): Improvement in symptoms so as ready for discharge Short Term Goals: Compliance with prescribed medications will improve Ability to verbalize feelings will improve Ability to disclose and discuss suicidal ideas  Medication Management: Evaluate patient's response, side effects, and tolerance of medication regimen.  Therapeutic Interventions: 1 to 1 sessions, Unit Group sessions and Medication administration.  Evaluation of Outcomes: Progressing  Physician Treatment Plan for Secondary Diagnosis: Principal Problem:   Schizoaffective disorder (HCC) Active Problems:   MDD (major depressive disorder)  Long Term Goal(s): Improvement in symptoms so as ready for  discharge  Short Term Goals: Compliance with prescribed medications will improve Ability to verbalize feelings will improve Ability to disclose and discuss suicidal ideas  Medication Management: Evaluate patient's response, side effects, and tolerance of medication regimen.  Therapeutic Interventions: 1 to 1 sessions, Unit Group sessions and Medication administration.  Evaluation of Outcomes: Progressing  RN Treatment Plan for Primary Diagnosis: Schizoaffective disorder (HCC) Long Term Goal(s): Knowledge of disease and therapeutic regimen to maintain health will improve  Short Term Goals: Compliance with prescribed medications will improve  Medication Management: RN will administer medications as ordered by provider, will assess and evaluate patient's response and provide education to patient for prescribed medication. RN will report any adverse and/or side effects to prescribing provider.  Therapeutic Interventions: 1 on 1 counseling sessions, Psychoeducation, Medication administration, Evaluate responses to treatment, Monitor vital signs and CBGs as ordered, Perform/monitor CIWA, COWS,  AIMS and Fall Risk screenings as ordered, Perform wound care treatments as ordered.  Evaluation of Outcomes: Progressing  LCSW Treatment Plan for Primary Diagnosis: Schizoaffective disorder (HCC) Long Term Goal(s): Safe transition to appropriate next level of care at discharge, Engage patient in therapeutic group addressing interpersonal concerns. Short Term Goals: Engage patient in aftercare planning with referrals and resources, Increase ability to appropriately verbalize feelings, Increase emotional regulation, Identify triggers associated with mental health/substance abuse issues and Increase skills for wellness and recovery  Therapeutic Interventions: Assess for all discharge needs, 1 to 1 time with Social worker, Explore available resources and support systems, Assess for adequacy in community  support network, Educate family and significant other(s) on suicide prevention, Complete Psychosocial Assessment, Interpersonal group therapy.  Evaluation of Outcomes: Progressing  Progress in Treatment: Attending groups: Intermittently   Participating in groups: Yes, when he attends. Taking medication as prescribed: Yes, MD continues to assess for medication changes as needed Toleration medication: Yes, no side effects reported at this time Family/Significant other contact made: No, CSW attempting contact with pt's girlfriend. Patient understands diagnosis: Developing insight  Discussing patient identified problems/goals with staff: Yes Medical problems stabilized or resolved: Yes Denies suicidal/homicidal ideation: Yes  Issues/concerns per patient self-inventory: None Other: N/A  New problem(s) identified: None identified at this time.   New Short Term/Long Term Goal(s): None identified at this time.   Discharge Plan or Barriers: Pt will return home and follow up with an outpatient provider.  Reason for Continuation of Hospitalization:  Anxiety  Depression Medication stabilization Suicidal ideation  Estimated Length of Stay: 1-3 days; Estimated discharge date 07/17/17  Attendees: Patient: 07/14/2017 2:25 PM  Physician: Dr. Jama Flavors 07/14/2017 2:25 PM  Nursing: Alexia Freestone RN; Armando Reichert, RN 07/14/2017 2:25 PM  RN Care Manager: 07/14/2017 2:25 PM  Social Worker: Donnelly Stager, LCSWA 07/14/2017 2:25 PM  Recreational Therapist:  07/14/2017 2:25 PM  Other: Armandina Stammer, NP 07/14/2017 2:25 PM  Other:  07/14/2017 2:25 PM  Other: 07/14/2017 2:25 PM  Scribe for Treatment Team: Jonathon Jordan, MSW,LCSWA 07/14/2017 2:25 PM

## 2017-07-14 NOTE — Care Management Note (Signed)
Follow up appointment rescheduled with Dr. Wyvonnia [facility]oraNunez [facility] Please see discharge instructions.

## 2017-07-14 NOTE — Progress Notes (Signed)
D: Patient observed up and visible around the unit. Frequent contacts made 1:1 throughout shift and patient frequently at nurses' station. Patient verbalizes to this Clinical research associatewriter his frustration at not receiving pain medications and goes into great detail about his injuries. States to this Clinical research associatewriter, "I'm fucking ill right now." Patient's affect animated, angry at times and mood labile however patient has not acted out. Per self inventory and discussions with writer, rates depression, hopelessness and anxiety all at an 8/10. Rates sleep as fair, appetite as fair and concentration as poor.  States goal for today is "getting well, try to meet my goal and have a good day." States pain from fractured L arm is "excruciating" however faces pain scale is a 4-5/10. Patient states pain score is a 9/10. No other physical complaints.   A: Medicated per orders, prn advil and vistaril given for discomfort and anxiety. Level III obs in place for safety. Redirected behavior as needed. Emotional support offered and self inventory reviewed. Encouraged completion of Suicide Safety Plan and programming participation. Discussed POC with MD, SW.    R: Patient verbalizes understanding of POC. On reassess, patient reports pain is a 7/10, some relief from vistaril. While patient endorsed passive SI yesterday, he denies today. Verbal contract for safety remains in place. No HI/AVH reported and patient remains safe on level III obs.

## 2017-07-14 NOTE — Plan of Care (Signed)
Problem: Self-Care: Goal: Ability to participate in self-care as condition permits will improve Outcome: Completed/Met Date Met: 07/14/17 Patient completing ADLs, cares for self independently.   Problem: Self-Concept: Goal: Ability to verbalize positive feelings about self will improve Outcome: Completed/Met Date Met: 07/14/17 Patient verbalizes positive self esteem.
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
## 2017-07-14 NOTE — [facility] Group Notes (Signed)
[facility] LCSW Group Therapy 07/14/2017 1:15pm  Type of Therapy: Group Therapy- Feelings Around Relapse and Recovery  Participation Level: Pt invited. Did not attend.    Donnelly StagerLynn Rosebud Koenen, MSW, Theresia MajorsLCSWA [phone] 07/14/2017 3:14 PM

## 2017-07-15 DIAGNOSIS — F322 Major depressive disorder, single episode, severe without psychotic features: Secondary | ICD-10-CM

## 2017-07-15 DIAGNOSIS — F515 Nightmare disorder: Secondary | ICD-10-CM

## 2017-07-15 DIAGNOSIS — F17210 Nicotine dependence, cigarettes, uncomplicated: Secondary | ICD-10-CM

## 2017-07-15 DIAGNOSIS — F1290 Cannabis use, unspecified, uncomplicated: Secondary | ICD-10-CM

## 2017-07-15 DIAGNOSIS — F4310 Post-traumatic stress disorder, unspecified: Secondary | ICD-10-CM

## 2017-07-15 DIAGNOSIS — G4700 Insomnia, unspecified: Secondary | ICD-10-CM

## 2017-07-15 LAB — HEMOGLOBIN A1C
Hgb A1c MFr Bld: 5.6 % (ref 4.8–5.6)
Mean Plasma Glucose: 114 mg/dL

## 2017-07-15 MED ORDER — LORAZEPAM 1 MG PO TABS
1.0000 mg | ORAL_TABLET | Freq: Once | ORAL | Status: AC
  Administered 2017-07-15: 1 mg via ORAL

## 2017-07-15 MED ORDER — PRAZOSIN HCL 2 MG PO CAPS
2.0000 mg | ORAL_CAPSULE | Freq: Once | ORAL | Status: AC
  Administered 2017-07-15: 2 mg via ORAL
  Filled 2017-07-15: qty 1

## 2017-07-15 MED ORDER — FLUOXETINE HCL 20 MG PO CAPS
40.0000 mg | ORAL_CAPSULE | Freq: Every day | ORAL | Status: DC
  Administered 2017-07-16 – 2017-07-18 (×3): 40 mg via ORAL
  Filled 2017-07-15 (×3): qty 2
  Filled 2017-07-15: qty 10
  Filled 2017-07-15 (×2): qty 2

## 2017-07-15 MED ORDER — LORAZEPAM 1 MG PO TABS
ORAL_TABLET | ORAL | Status: AC
  Filled 2017-07-15: qty 1

## 2017-07-15 NOTE — Progress Notes (Signed)
Nursing Progress Note 1900-0730  D) Patient presents sad, depressed, and anxious. Patient is somewhat tearful and states "I had a rough day". Patient requests medication for anxiety. Patient reports "they were supposed to increase my minipress, I keep waking up with nightmares". Patient appears angry about minipress order. Patient encouraged to speak to providers tomorrow and informed writer will discuss with NP this evening. Patient reports passive SI and continues to endorse command hallucinations. Patient does not appear to respond to internal stimuli. Patient denies HI or pain. Patient contracts for safety on the unit.  A) NP Barbara CowerJason notified for modification of minipress order. New orders received. Emotional support given. 1:1 interaction and active listening provided. Patient medicated as prescribed. Medications and plan of care reviewed with patient. Patient verbalized understanding without further questions. Snacks and fluids provided. Opportunities for questions or concerns presented to patient. Patient encouraged to continue to work on treatment goals. Labs, vital signs and patient behavior monitored throughout shift. Patient safety maintained with q15 min safety checks. Low fall risk precautions in place and reviewed with patient; patient verbalized understanding.  R) Patient receptive to interaction with nurse. Patient remains safe on the unit at this time. Patient denies any adverse medication reactions at this time. Patient is resting in bed without complaints. Will continue to monitor.

## 2017-07-15 NOTE — Progress Notes (Signed)
Data. Patient denies HI. PAtient continues to endorse SI without a plan and is able to verbally contract for safety on the unit and to come to staff prior to acting on any self harm thoughts/feelings. Patient also endorses AH, "They are telling me to kill myself."  Patient interacting well with staff and other patients, for most of the shift. Just before dinner, patient reported to nurse, "I just found out that they found my best friend dead." Patient visibly upset. Shaking of extremities and trunk noted. Patient weeping loudly. NP notified and a one time dose of ativan ordered and administered. Patient spent time sitting on his bed talking to nurse about his friend and, "How the people who don't want to go, and those that do, don't go. They are stuck here." Patient repeated , "Why wasn't it me." Patient alo stated, "I don't think it is a good idea for me to leave on Monday, I am so distraught, that I will probably try to kill myself." Patient again able to verbally contract for safety on the unit and reports he will come talk to staff and not harm himself. Was able to calm down and remain appropriate while in the dayroom. Action. Emotional support and encouragement offered. Education provided on medication, indications and side effect. Q 15 minute checks done for safety. Response. Safety on the unit maintained through 15 minute checks.  Medications taken as prescribed. Attended groups. Patient visibly calmer after PRN and speaking with nurse.

## 2017-07-15 NOTE — Progress Notes (Signed)
[facility] MD Progress Note  07/15/2017 11:06 AM Daelin Haste [facility]  MRN:  [id number] Subjective:  States to be low but not hopeless. Talked about his past and concerns of loosing family member. Some nightmares  Objective: I have discussed case with treatment team and have met with patient. Patient is cooperative, alert. States to be feeling low . Expresses responsibility of should not be using drugs or marijuana. Sleep was poor which has made him irritable. States he does better on prozac 51m and want to increase  it Does not endorse hallucinations today.  Not disruptive  Labs reviewed Has had orthopedic  menions nightmares not better and wants to review minipress dose.  . Principal Problem:  PTSD, consider MDD with Psychotic Features  Diagnosis:   Patient Active Problem List   Diagnosis Date Noted  . Schizoaffective disorder (H[place]ris [F25.9] 07/13/2017  . MDD (major depressive disorder) [F32.9] 07/12/2017  . Severe recurrent major depressive disorder with psychotic features (HOrient [F33.3] 06/28/2016  . Suicidal behavior [R46.89] 06/23/2016   Total Time spent with patient: 20 minutes  Past Medical History:  Past Medical History:  Diagnosis Date  . Anxiety   . Asthma    History reviewed. No pertinent surgical history. Family History: History reviewed. No pertinent family history. Social History:  History  Alcohol Use  . Yes     History  Drug Use  . Types: Marijuana    Social History   Social History  . Marital status: Single    Spouse name: N/A  . Number of children: N/A  . Years of education: N/A   Social History Main Topics  . Smoking status: Current Every Day Smoker    Packs/day: 1.00    Types: Cigarettes  . Smokeless tobacco: Current User    Types: Chew  . Alcohol use Yes  . Drug use: Yes    Types: Marijuana  . Sexual activity: Not Currently   Other Topics Concern  . None   Social History Narrative  . None   Additional Social History:    Pain Medications:  please see mar Prescriptions: please see mar Over the Counter: please see mar History of alcohol / drug use?: Yes Longest period of sobriety (when/how long): unknown Name of Substance 1: cocaine 1 - Age of First Use: unknown 1 - Amount (size/oz): 1 gram 1 - Frequency: daily 1 - Duration: ongoing 1 - Last Use / Amount: 07/11/17 Name of Substance 2: marijuana 2 - Age of First Use: unknown 2 - Amount (size/oz): unknown 2 - Frequency: occasional 2 - Duration: ongoing 2 - Last Use / Amount: unknown  Sleep: improving   Appetite:  improving   Current Medications: Current Facility-Administered Medications  Medication Dose Route Frequency Provider Last Rate Last Dose  . acetaminophen (TYLENOL) tablet 650 mg  650 mg Oral Q6H PRN LDerrill Center NP      . albuterol (PROVENTIL HFA;VENTOLIN HFA) 108 (90 Base) MCG/ACT inhaler 1-2 puff  1-2 puff Inhalation Q6H PRN Cobos, FMyer Peer MD   2 puff at 07/14/17 2027  . alum & mag hydroxide-simeth (MAALOX/MYLANTA) 200-200-20 MG/5ML suspension 30 mL  30 mL Oral Q4H PRN LDerrill Center NP      . ARIPiprazole (ABILIFY) tablet 10 mg  10 mg Oral Daily Money, TLowry Ram FNP   10 mg at 07/15/17 0745  . [START ON 07/16/2017] FLUoxetine (PROZAC) capsule 40 mg  40 mg Oral Daily AMerian Capron MD      . gabapentin (NEURONTIN) capsule 200  mg  200 mg Oral TID Cobos, Myer Peer, MD   200 mg at 07/15/17 0746  . hydrOXYzine (ATARAX/VISTARIL) tablet 50 mg  50 mg Oral Q6H PRN Laverle Hobby, PA-C   50 mg at 07/14/17 2216  . ibuprofen (ADVIL,MOTRIN) tablet 800 mg  800 mg Oral Q8H PRN Cobos, Myer Peer, MD   800 mg at 07/15/17 3295  . lisinopril (PRINIVIL,ZESTRIL) tablet 10 mg  10 mg Oral Daily Derrill Center, NP   10 mg at 07/15/17 0746  . magnesium hydroxide (MILK OF MAGNESIA) suspension 30 mL  30 mL Oral Daily PRN Derrill Center, NP      . mirtazapine (REMERON) tablet 7.5 mg  7.5 mg Oral QHS Cobos, Myer Peer, MD   7.5 mg at 07/14/17 2147  . mometasone-formoterol  (DULERA) 100-5 MCG/ACT inhaler 2 puff  2 puff Inhalation BID Lindon Romp A, NP   2 puff at 07/15/17 0745  . nicotine (NICODERM CQ - dosed in mg/24 hours) patch 21 mg  21 mg Transdermal Daily Cobos, Myer Peer, MD   21 mg at 07/15/17 0747  . prazosin (MINIPRESS) capsule 2 mg  2 mg Oral QHS Cobos, Myer Peer, MD   2 mg at 07/14/17 2147  . traZODone (DESYREL) tablet 50 mg  50 mg Oral QHS PRN Derrill Center, NP   50 mg at 07/14/17 2148    Lab Results:  Results for orders placed or performed during the hospital encounter of 07/12/17 (from the past 48 hour(s))  Hemoglobin A1c     Status: None   Collection Time: 07/14/17  6:22 AM  Result Value Ref Range   Hgb A1c MFr Bld 5.6 4.8 - 5.6 %    Comment: (NOTE)         Pre-diabetes: 5.7 - 6.4         Diabetes: >6.4         Glycemic control for adults with diabetes: <7.0    Mean Plasma Glucose 114 mg/dL    Comment: (NOTE) Performed At: [facility] [place], [place] [id number] Lindon Romp MD [id number] Performed at [facility], [place] [address] [place],  [address]   Lipid panel     Status: None   Collection Time: 07/14/17  6:22 AM  Result Value Ref Range   Cholesterol 145 0 - 200 mg/dL   Triglycerides 135 <150 mg/dL   HDL 50 >40 mg/dL   Total CHOL/HDL Ratio 2.9 RATIO   VLDL 27 0 - 40 mg/dL   LDL Cholesterol 68 0 - 99 mg/dL    Comment:        Total Cholesterol/HDL:CHD Risk Coronary Heart Disease Risk Table                     Men   Women  1/2 Average Risk   3.4   3.3  Average Risk       5.0   4.4  2 X Average Risk   9.6   7.1  3 X Average Risk  23.4   11.0        Use the calculated Patient Ratio above and the CHD Risk Table to determine the patient's CHD Risk.        ATP III CLASSIFICATION (LDL):  <100     mg/dL   Optimal  100-129  mg/dL   Near or Above  Optimal  130-159  mg/dL   Borderline  160-189  mg/dL   High  >190     mg/dL   Very High Performed at  [place] [address], [place], [place] [address]   TSH     Status: None   Collection Time: 07/14/17  6:22 AM  Result Value Ref Range   TSH 1.182 0.350 - 4.500 uIU/mL    Comment: Performed by a 3rd Generation assay with a functional sensitivity of <=0.01 uIU/mL. Performed at [facility], [place] [address], [place], [place] [address]     Blood Alcohol level:  No results found for: [facility]  Metabolic Disorder Labs: Lab Results  Component Value Date   HGBA1C 5.6 07/14/2017   MPG 114 07/14/2017   MPG 111 06/27/2016   Lab Results  Component Value Date   PROLACTIN 16.6 (H) 06/28/2016   Lab Results  Component Value Date   CHOL 145 07/14/2017   TRIG 135 07/14/2017   HDL 50 07/14/2017   CHOLHDL 2.9 07/14/2017   VLDL 27 07/14/2017   LDLCALC 68 07/14/2017   LDLCALC 60 06/28/2016    Physical Findings: AIMS: Facial and Oral Movements Muscles of Facial Expression: None, normal Lips and Perioral Area: None, normal Jaw: None, normal Tongue: None, normal,Extremity Movements Upper (arms, wrists, hands, fingers): None, normal Lower (legs, knees, ankles, toes): None, normal, Trunk Movements Neck, shoulders, hips: None, normal, Overall Severity Severity of abnormal movements (highest score from questions above): None, normal Incapacitation due to abnormal movements: None, normal Patient's awareness of abnormal movements (rate only patient's report): No Awareness, Dental Status Current problems with teeth and/or dentures?: No Does patient usually wear dentures?: No  CIWA:    COWS:     Musculoskeletal: Strength & Muscle Tone: within normal limits Gait & Station: normal Patient leans: N/A  Psychiatric Specialty Exam: Physical Exam  Constitutional: He appears well-developed.    Review of Systems  Cardiovascular: Negative for chest pain.  Psychiatric/Behavioral: Positive for substance abuse. Negative for suicidal ideas. The patient is nervous/anxious.     denies chest pain, no shortness of breath, (+) pain on forearm ( fractured- currently has cast ) , which he describes as dull ache, worse on wet/humid days, no fever, no chills   Blood pressure (!) 140/99, pulse (!) 101, temperature 98.7 F (37.1 C), temperature source Oral, resp. rate 18, height 5' 7.5" (1.715 m), weight 72.5 kg (159 lb 12.8 oz), SpO2 100 %.Body mass index is 24.66 kg/m.  General Appearance: improving grooming   Eye Contact:  Good  Speech:  Normal Rate  Volume:  Normal  Mood: dysphoric  Affect:  Reactive   Thought Process:  Linear and Descriptions of Associations: Intact  Orientation:  Full (Time, Place, and Person)  Thought Content:  decreased auditory hallucinations, not currently internally preoccupied, no delusions expressed   Suicidal Thoughts:  No denies suicidal or self injurious plan or intention at this time and contracts for safety, denies homicidal or violent ideations  Homicidal Thoughts:  No  Memory:  recent and remote grossly intact   Judgement:  Other:  fair- improving   Insight:  fair- improving   Psychomotor Activity:  Normal  Concentration:  Concentration: Good and Attention Span: Good  Recall:  Good  Fund of Knowledge:  Good  Language:  Good  Akathisia:  Negative  Handed:  Right  AIMS (if indicated):     Assets:  Communication Skills Desire for Improvement Resilience  ADL's:  Intact  Cognition:  WNL  Sleep:  Number of Hours: 6.75   Assessment - describes ongoing symptoms of depression and nightmares.  Treatment Plan Summary: Daily contact with patient to assess and evaluate symptoms and progress in treatment, Medication management, Plan inpatient admission and medications as below Encourage group and milieu participation to work on coping skills and symptom reduction Will increase prozac to 61m for depression, anxiety and PTSD Continue Neurontin to 200 mgrs TID for anxiety, pain Continue  Minipress to 2 mgrs QHS for PTSD related  nightmares  Continue Remeron 7.5 mgrs QHS for depression and insomnia Continue Abilify 10 mgrs QDAY for mood disorder, psychotic symptoms Currently on NSAID PRN for pain as needed .  Check GC/Chlamydia and RPR Treatment team working on disposition planning options  AMerian Capron MD 07/15/2017, 11:06 AM

## 2017-07-15 NOTE — B[facility] Group Notes (Signed)
Adult Therapy Group Note (Clinical Social Work)  Date:  07/15/2017  Time:  10:00-11:00AM  Group Topic/Focus:  HEALTHY COPING SKILLS  Today's group focused on identifying unhealthy and healthy coping skills already in use by each patient.   There was much sharing, support, psychoeducation, and encouragement provided between patients.  A common theme of anxiety and depression, with some grief issues, was expressed among patients.  Ideas about healthy coping skills to learn were generated.   A mindfulness exercise was performed as an example of a healthy coping skill.  Participation Level:  Active  Participation Quality:  Attentive, Sharing and Supportive  Affect:  Appropriate  Cognitive:  Appropriate  Insight: Good  Engagement in Group:  Developing/Improving  Modes of Intervention:  Discussion, Support and Processing  Additional Comments:  The patient shared that the reason he is in the hospital is depression, anxiety, PTSD, and suicidal thoughts.  He was in and out of the room several times, but while present provided insightful comments.  He was not present for the mindfulness exercise.  Carloyn JaegerMareida J Grossman-Orr 05/13/2017, 1:28 PM

## 2017-07-15 NOTE — B[facility] Group Notes (Signed)
Identifying Needs   Date:  07/15/2017  Time:  1300  Type of Therapy:  Nurse Education  The group is focused on teaching patients how o identify their needs and then how to develop the skills needed to get them met.   Participation Level:  Minimal   Participation Quality:  Attentive  Affect:  Appropriate  Cognitive:  Alert  Insight:  Limited  Engagement in Group:  Engaged  Modes of Intervention:  Discussion  Summary of Progress/Problems:  Lauralyn Primes 07/15/2017, 3:05 PM

## 2017-07-15 NOTE — Plan of Care (Signed)
Problem: Safety: Goal: Periods of time without injury will increase Outcome: Progressing Patient is on q15 minute safety checks and low fall risk precautions. Patient contracts for safety on the unit.

## 2017-07-16 LAB — RPR: RPR: NONREACTIVE

## 2017-07-16 MED ORDER — ARIPIPRAZOLE 15 MG PO TABS
15.0000 mg | ORAL_TABLET | Freq: Every day | ORAL | Status: DC
  Administered 2017-07-17 – 2017-07-18 (×2): 15 mg via ORAL
  Filled 2017-07-16: qty 5
  Filled 2017-07-16 (×4): qty 1

## 2017-07-16 NOTE — B[facility] Group Notes (Signed)
Healthy Support Systems  Date:  07/16/2017  Time:  1300  Type of Therapy:  Nurse Education  /  Healthy support systems:  The group focuses on teaching patients how to develop and utilize healthy support systmes to aide in their recovery.  Participation Level:  Patient did not attend  Participation Quality:    Affect:    Cognitive:    Insight:    Engagement in Group:    Modes of Intervention:    Summary of Progress/Problems:  Edward Stone, Edward Stone 07/16/2017, 2:50 PM

## 2017-07-16 NOTE — B[facility] Group Notes (Signed)
[facility] Group Notes: (Clinical Social Work)   07/16/2017      Type of Therapy:  Group Therapy   Participation Level:  Did Not Attend despite MHT prompting - was asleep in group room for first part of group   Ambrose MantleMareida Grossman-Orr, LCSW 07/16/2017, 12:34 PM

## 2017-07-16 NOTE — Progress Notes (Signed)
Patient attended group and said that his day was a 4. Patient  said he had nothing to share.

## 2017-07-16 NOTE — Progress Notes (Signed)
D: Pt presents with an animated affect and anxious mood. Pt rates anxiety 10/10. Depression 10/10. Pt endorses AVH. Seeing "figures of a woman". Hearing the voice of a woman telling him to kill himself. Pt endorses active suicidal thoughts. Pt denies SI plan or intent. Pt verbally contracts for safety. Pt verbalized to writer that he found out yesterday that his best friend was killed yesterday.  A: Medications reviewed with pt. Medications administered as ordered per MD. Verbal support provided. Pt encouraged to attend groups as scheduled throughout the day. 15 minute checks performed for safety. R: Pt receptive to tx.

## 2017-07-16 NOTE — Plan of Care (Signed)
Problem: Education: Goal: Knowledge of the prescribed therapeutic regimen will improve Outcome: Progressing Pt verbalizes understanding of med regimen and is able to verbalize scheduled meds to Clinical research associatewriter.

## 2017-07-16 NOTE — Plan of Care (Signed)
Problem: Education: Goal: Ability to make informed decisions regarding treatment will improve Outcome: Progressing Pt self Inventory completed. Pt able to make decisions regarding care AEB requesting to have meds increased due to increased symptoms of depression, anxiety and AVH.

## 2017-07-16 NOTE — Plan of Care (Signed)
Problem: Activity: Goal: Sleeping patterns will improve Outcome: Progressing Patient is resting in bed without complaints.   

## 2017-07-16 NOTE — Progress Notes (Signed)
[facility] MD Progress Note  07/16/2017 10:44 AM Edward Stone [facility]  MRN:  [id number] Subjective: states to feel low . Heard one of his best friend died. Poor sleep  Objective: I have discussed case with treatment team and have met with patient. Patient is cooperative and did not show improvement today . Has heard of friends death which has put him more down. Wanted to increase gabapentin . We talked about other options of medication change including abilify Otherwise attending groups and communicating with other patients   Sleep remains disruptive with nightmares. Have reviewed minipress dose and sleep hygiene No psychosis Somewhat demanding in regard to medication changes and his opinion of their effect  Principal Problem:  PTSD, consider MDD with Psychotic Features  Diagnosis:   Patient Active Problem List   Diagnosis Date Noted  . Schizoaffective disorder ([place]) [F25.9] 07/13/2017  . MDD (major depressive disorder) [F32.9] 07/12/2017  . 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 Suicidal behavior [R46.89] 06/23/2016   Total Time spent with patient: 20 minutes  Past Medical History:  Past Medical History:  Diagnosis Date  . Anxiety   . Asthma    History reviewed. No pertinent surgical history. Family History: History reviewed. No pertinent family history. Social History:  History  Alcohol Use  . Yes     History  Drug Use  . Types: Marijuana    Social History   Social History  . Marital status: Single    Spouse name: N/A  . Number of children: N/A  . Years of education: N/A   Social History Main Topics  . Smoking status: Current Every Day Smoker    Packs/day: 1.00    Types: Cigarettes  . Smokeless tobacco: Current User    Types: Chew  . Alcohol use Yes  . Drug use: Yes    Types: Marijuana  . Sexual activity: Not Currently   Other Topics Concern  . None   Social History Narrative  . None   Additional Social  History:    Pain Medications: please see mar Prescriptions: please see mar Over the Counter: please see mar History of alcohol / drug use?: Yes Longest period of sobriety (when/how long): unknown Name of Substance 1: cocaine 1 - Age of First Use: unknown 1 - Amount (size/oz): 1 gram 1 - Frequency: daily 1 - Duration: ongoing 1 - Last Use / Amount: 07/11/17 Name of Substance 2: marijuana 2 - Age of First Use: unknown 2 - Amount (size/oz): unknown 2 - Frequency: occasional 2 - Duration: ongoing 2 - Last Use / Amount: unknown  Sleep: improving   Appetite:  improving   Current Medications: Current Facility-Administered Medications  Medication Dose Route Frequency Provider Last Rate Last Dose  . acetaminophen (TYLENOL) tablet 650 mg  650 mg Oral Q6H PRN Derrill Center, NP   650 mg at 07/15/17 1648  . albuterol (PROVENTIL HFA;VENTOLIN HFA) 108 (90 Base) MCG/ACT inhaler 1-2 puff  1-2 puff Inhalation Q6H PRN Cobos, Myer Peer, MD   2 puff at 07/16/17 [phone]  . alum & mag hydroxide-simeth (MAALOX/MYLANTA) 200-200-20 MG/5ML suspension 30 mL  30 mL Oral Q4H PRN Derrill Center, NP      . Derrill Memo ON 07/17/2017] ARIPiprazole (ABILIFY) tablet 15 mg  15 mg Oral Daily Merian Capron, MD      . FLUoxetine (PROZAC) capsule 40 mg  40 mg Oral Daily Merian Capron, MD   40 mg at 07/16/17 0827  . gabapentin (NEURONTIN)  capsule 200 mg  200 mg Oral TID Cobos, Fernando A, MD   200 mg at 07/16/17 0827  . hydrOXYzine (ATARAX/VISTARIL) tablet 50 mg  50 mg Oral Q6H PRN Laverle Hobby, PA-C   50 mg at 07/16/17 0240  . ibuprofen (ADVIL,MOTRIN) tablet 800 mg  800 mg Oral Q8H PRN Cobos, Myer Peer, MD   800 mg at 07/15/17 9735  . lisinopril (PRINIVIL,ZESTRIL) tablet 10 mg  10 mg Oral Daily Derrill Center, NP   10 mg at 07/16/17 3299  . magnesium hydroxide (MILK OF MAGNESIA) suspension 30 mL  30 mL Oral Daily PRN Derrill Center, NP      . mirtazapine (REMERON) tablet 7.5 mg  7.5 mg Oral QHS Cobos, Myer Peer, MD    7.5 mg at 07/15/17 2111  . mometasone-formoterol (DULERA) 100-5 MCG/ACT inhaler 2 puff  2 puff Inhalation BID Lindon Romp A, NP   2 puff at 07/16/17 0636  . nicotine (NICODERM CQ - dosed in mg/24 hours) patch 21 mg  21 mg Transdermal Daily Cobos, Myer Peer, MD   21 mg at 07/16/17 2426  . prazosin (MINIPRESS) capsule 2 mg  2 mg Oral QHS Cobos, Myer Peer, MD   2 mg at 07/15/17 2112  . traZODone (DESYREL) tablet 50 mg  50 mg Oral QHS PRN Derrill Center, NP   50 mg at 07/15/17 2112    Lab Results:  Results for orders placed or performed during the hospital encounter of 07/12/17 (from the past 48 hour(s))  RPR     Status: None   Collection Time: 07/15/17  6:23 AM  Result Value Ref Range   RPR Ser Ql Non Reactive Non Reactive    Comment: (NOTE) Performed At: [facility] [place], [place] [id number] Lindon Romp MD [id number] Performed at [facility], [place] [address], [place], [place] [address]     Blood Alcohol level:  No results found for: [facility]  Metabolic Disorder Labs: Lab Results  Component Value Date   HGBA1C 5.6 07/14/2017   MPG 114 07/14/2017   MPG 111 06/27/2016   Lab Results  Component Value Date   PROLACTIN 16.6 (H) 06/28/2016   Lab Results  Component Value Date   CHOL 145 07/14/2017   TRIG 135 07/14/2017   HDL 50 07/14/2017   CHOLHDL 2.9 07/14/2017   VLDL 27 07/14/2017   LDLCALC 68 07/14/2017   LDLCALC 60 06/28/2016    Physical Findings: AIMS: Facial and Oral Movements Muscles of Facial Expression: None, normal Lips and Perioral Area: None, normal Jaw: None, normal Tongue: None, normal,Extremity Movements Upper (arms, wrists, hands, fingers): None, normal Lower (legs, knees, ankles, toes): None, normal, Trunk Movements Neck, shoulders, hips: None, normal, Overall Severity Severity of abnormal movements (highest score from questions above): None, normal Incapacitation due to abnormal movements: None,  normal Patient's awareness of abnormal movements (rate only patient's report): No Awareness, Dental Status Current problems with teeth and/or dentures?: No Does patient usually wear dentures?: No  CIWA:    COWS:     Musculoskeletal: Strength & Muscle Tone: within normal limits Gait & Station: normal Patient leans: N/A  Psychiatric Specialty Exam: Physical Exam  Constitutional: He appears well-developed.    Review of Systems  Cardiovascular: Negative for palpitations.  Psychiatric/Behavioral: Positive for depression. Negative for suicidal ideas. The patient is nervous/anxious.    denies chest pain, no shortness of breath, (+) pain on forearm ( fractured- currently has cast ) , which  he describes as dull ache, worse on wet/humid days, no fever, no chills   Blood pressure 119/77, pulse 76, temperature 99.4 F (37.4 C), temperature source Oral, resp. rate 18, height 5' 7.5" (1.715 m), weight 72.5 kg (159 lb 12.8 oz), SpO2 99 %.Body mass index is 24.66 kg/m.  General Appearance: improving grooming   Eye Contact:  Good  Speech:  Normal Rate  Volume:  Normal  Mood: dysphoric  Affect:  subdued   Thought Process:  Linear and Descriptions of Associations: Intact  Orientation:  Full (Time, Place, and Person)  Thought Content:  decreased auditory hallucinations, not currently internally preoccupied, no delusions expressed   Suicidal Thoughts:  No   Homicidal Thoughts:  No  Memory:  recent and remote grossly intact   Judgement:  Other:  fair- improving   Insight:  fair- improving   Psychomotor Activity:  Normal  Concentration:  Concentration: Good and Attention Span: Good  Recall:  Good  Fund of Knowledge:  Good  Language:  Good  Akathisia:  Negative  Handed:  Right  AIMS (if indicated):     Assets:  Communication Skills Desire for Improvement Resilience  ADL's:  Intact  Cognition:  WNL  Sleep:  Number of Hours: 6.75   Assessment - describes ongoing feeling subdued and now  grief over friends death.  Treatment Plan Summary: Daily contact with patient to assess and evaluate symptoms and progress in treatment, Medication management, Plan inpatient admission and medications as below Encourage group and milieu participation to work on coping skills and symptom reduction continue prozac which was increaesed yesterday to  44m for depression, anxiety and PTSD Continue Neurontin to 200 mgrs TID for anxiety, pain Continue  Minipress to 2 mgrs QHS for PTSD related nightmares  Continue Remeron 7.5 mgrs QHS for depression and insomnia Increase Abilify to 16m QDAY for mood disorder, psychotic symptoms Currently on NSAID PRN for pain as needed .   Treatment team working on disposition planning options  AKMerian CapronMD 07/16/2017, 10:44 AM

## 2017-07-17 ENCOUNTER — Inpatient Hospital Stay (HOSPITAL_COMMUNITY): Payer: Federal, State, Local not specified - Other

## 2017-07-17 DIAGNOSIS — F4310 Post-traumatic stress disorder, unspecified: Secondary | ICD-10-CM | POA: Insufficient documentation

## 2017-07-17 DIAGNOSIS — F251 Schizoaffective disorder, depressive type: Secondary | ICD-10-CM

## 2017-07-17 DIAGNOSIS — Z634 Disappearance and death of family member: Secondary | ICD-10-CM

## 2017-07-17 LAB — GC/CHLAMYDIA PROBE AMP (~~LOC~~) NOT AT ARMC
CHLAMYDIA, DNA PROBE: NEGATIVE
Neisseria Gonorrhea: NEGATIVE

## 2017-07-17 NOTE — [facility] Group Notes (Signed)
[facility] LCSW Group Therapy Note  Date/Time 07/17/2017 1:30 PM  Type of Therapy/Topic:  Group Therapy:  Balance in Life  Participation Level:    Description of Group:    This group will address the concept of balance and how it feels and looks when one is unbalanced. Patients will be encouraged to process areas in their lives that are out of balance, and identify reasons for remaining unbalanced. Facilitators will guide patients utilizing problem- solving interventions to address and correct the stressor making their life unbalanced. Understanding and applying boundaries will be explored and addressed for obtaining  and maintaining a balanced life. Patients will be encouraged to explore ways to assertively make their unbalanced needs known to significant others in their lives, using other group members and facilitator for support and feedback.  Therapeutic Goals: 1. Patient will identify two or more emotions or situations they have that consume much of in their lives. 2. Patient will identify signs/triggers that life has become out of balance:  3. Patient will identify two ways to set boundaries in order to achieve balance in their lives:  4. Patient will demonstrate ability to communicate their needs through discussion and/or role plays  Summary of Patient Progress:  Patient briefly participated in group however was concerned about upcoming visit to Emergency Department, went in and out of the room frequently.  Was able to identify nature/outdoors as a place where he feels balanced, "when I hear the waves lapping at the shore, it is soothing to me."  Patient encouraged to maintain a mental picture of this scene in order to provide a soothing experience.  Therapeutic Modalities:   Cognitive Behavioral Therapy Solution-Focused Therapy Assertiveness Training  Santa GeneraAnne Lorre Opdahl, LCSW Lead Clinical Social Worker Phone:  [phone]

## 2017-07-17 NOTE — Plan of Care (Signed)
Problem: Self-Concept: Goal: Ability to disclose and discuss suicidal ideas will improve Outcome: Progressing Pt verbalized to writer decreased SI. Pt denies active SI. Pt verbally contracts for safety and agrees to Lexicographernotify writer of any changes.

## 2017-07-17 NOTE — ED Notes (Signed)
[facility] and needs to have his cast (located on his left forearm) to be evaluated. Patient appears in no acute distress. Patient has a cast to his left wrist/forearm. He reports the cast should have been removed last week but it is still present. Patient reports yesterday while stretching with his left arm behind his head, he started experiencing increased pain with intermittent numbness to the left index and pinky finger. Patient is able to move all extremities and has cap refill less than 2 seconds.

## 2017-07-17 NOTE — Progress Notes (Signed)
[facility] MD Progress Note  07/17/2017 12:25 PM Abdo Denault [facility]  MRN:  [id number]   Subjective:  Patient presents and is cooperative but reports his depression and anxiety have increased due to his best friend being found dead 2 days ago and she was murdered, specifically strangled, in [place]. He also reports that a new patient on the [address] hall has had him upset and agitated, he does contract for safety. He denies any SI/HI/AVH at this time. He does not feel that he is ready for discharge. Patient denies any side effects due to medication and agrees to continue current regimen.  Objective: Patient appears depressed and has a flat affect. Reports increased anxiety and ongoing depression.  Suspected that patient may be seeking secondary gain , I could not find any mention of a murder or woman found dead in [place] recently. Also staff inform me that at group was a discussion about  losing loved ones and the patient did not mention  losing a close friend recently.     Principal Problem: Schizoaffective disorder ([place]) Diagnosis:   Patient Active Problem List   Diagnosis Date Noted  . Schizoaffective disorder ([place]) [F25.9] 07/13/2017  . MDD (major depressive disorder) [F32.9] 07/12/2017  . 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 Suicidal behavior [R46.89] 06/23/2016   Total Time spent with patient: 25 minutes  Past Psychiatric History: See H&P  Past Medical History:  Past Medical History:  Diagnosis Date  . Anxiety   . Asthma    History reviewed. No pertinent surgical history. Family History: History reviewed. No pertinent family history. Family Psychiatric  History: See H&P Social History:  History  Alcohol Use  . Yes     History  Drug Use  . Types: Marijuana    Social History   Social History  . Marital status: Single    Spouse name: N/A  . Number of children: N/A  . Years of education: N/A   Social History Main Topics  .  Smoking status: Current Every Day Smoker    Packs/day: 1.00    Types: Cigarettes  . Smokeless tobacco: Current User    Types: Chew  . Alcohol use Yes  . Drug use: Yes    Types: Marijuana  . Sexual activity: Not Currently   Other Topics Concern  . None   Social History Narrative  . None   Additional Social History:    Pain Medications: please see mar Prescriptions: please see mar Over the Counter: please see mar History of alcohol / drug use?: Yes Longest period of sobriety (when/how long): unknown Name of Substance 1: cocaine 1 - Age of First Use: unknown 1 - Amount (size/oz): 1 gram 1 - Frequency: daily 1 - Duration: ongoing 1 - Last Use / Amount: 07/11/17 Name of Substance 2: marijuana 2 - Age of First Use: unknown 2 - Amount (size/oz): unknown 2 - Frequency: occasional 2 - Duration: ongoing 2 - Last Use / Amount: unknown                Sleep: Good  Appetite:  Good  Current Medications: Current Facility-Administered Medications  Medication Dose Route Frequency Provider Last Rate Last Dose  . acetaminophen (TYLENOL) tablet 650 mg  650 mg Oral Q6H PRN Derrill Center, NP   650 mg at 07/17/17 1156  . albuterol (PROVENTIL HFA;VENTOLIN HFA) 108 (90 Base) MCG/ACT inhaler 1-2 puff  1-2 puff Inhalation Q6H PRN Cobos, Myer Peer, MD   2  puff at 07/17/17 4403  . alum & mag hydroxide-simeth (MAALOX/MYLANTA) 200-200-20 MG/5ML suspension 30 mL  30 mL Oral Q4H PRN Derrill Center, NP      . ARIPiprazole (ABILIFY) tablet 15 mg  15 mg Oral Daily Merian Capron, MD   15 mg at 07/17/17 0808  . FLUoxetine (PROZAC) capsule 40 mg  40 mg Oral Daily Merian Capron, MD   40 mg at 07/17/17 0808  . gabapentin (NEURONTIN) capsule 200 mg  200 mg Oral TID Cobos, Fernando A, MD   200 mg at 07/17/17 1154  . hydrOXYzine (ATARAX/VISTARIL) tablet 50 mg  50 mg Oral Q6H PRN Patriciaann Clan E, PA-C   50 mg at 07/17/17 4742  . ibuprofen (ADVIL,MOTRIN) tablet 800 mg  800 mg Oral Q8H PRN Cobos,  Myer Peer, MD   800 mg at 07/17/17 [phone]  . lisinopril (PRINIVIL,ZESTRIL) tablet 10 mg  10 mg Oral Daily Derrill Center, NP   10 mg at 07/17/17 0809  . magnesium hydroxide (MILK OF MAGNESIA) suspension 30 mL  30 mL Oral Daily PRN Derrill Center, NP      . mirtazapine (REMERON) tablet 7.5 mg  7.5 mg Oral QHS Cobos, Myer Peer, MD   7.5 mg at 07/16/17 2116  . mometasone-formoterol (DULERA) 100-5 MCG/ACT inhaler 2 puff  2 puff Inhalation BID Lindon Romp A, NP   2 puff at 07/17/17 [phone]  . nicotine (NICODERM CQ - dosed in mg/24 hours) patch 21 mg  21 mg Transdermal Daily Cobos, Myer Peer, MD   21 mg at 07/17/17 0809  . prazosin (MINIPRESS) capsule 2 mg  2 mg Oral QHS Cobos, Myer Peer, MD   2 mg at 07/16/17 2117  . traZODone (DESYREL) tablet 50 mg  50 mg Oral QHS PRN Derrill Center, NP   50 mg at 07/16/17 2118    Lab Results: No results found for this or any previous visit (from the past 48 hour(s)).  Blood Alcohol level:  No results found for: [facility]  Metabolic Disorder Labs: Lab Results  Component Value Date   HGBA1C 5.6 07/14/2017   MPG 114 07/14/2017   MPG 111 06/27/2016   Lab Results  Component Value Date   PROLACTIN 16.6 (H) 06/28/2016   Lab Results  Component Value Date   CHOL 145 07/14/2017   TRIG 135 07/14/2017   HDL 50 07/14/2017   CHOLHDL 2.9 07/14/2017   VLDL 27 07/14/2017   LDLCALC 68 07/14/2017   LDLCALC 60 06/28/2016    Physical Findings: AIMS: Facial and Oral Movements Muscles of Facial Expression: None, normal Lips and Perioral Area: None, normal Jaw: None, normal Tongue: None, normal,Extremity Movements Upper (arms, wrists, hands, fingers): None, normal Lower (legs, knees, ankles, toes): None, normal, Trunk Movements Neck, shoulders, hips: None, normal, Overall Severity Severity of abnormal movements (highest score from questions above): None, normal Incapacitation due to abnormal movements: None, normal Patient's awareness of abnormal movements (rate  only patient's report): No Awareness, Dental Status Current problems with teeth and/or dentures?: No Does patient usually wear dentures?: No  CIWA:    COWS:     Musculoskeletal: Strength & Muscle Tone: within normal limits Gait & Station: normal Patient leans: N/A  Psychiatric Specialty Exam: Physical Exam  Nursing note and vitals reviewed. Constitutional: He appears well-developed and well-nourished.  Musculoskeletal:  left upper extremity distal end in cast, all other normal ROM    Review of Systems  Constitutional: Negative.   HENT: Negative.   Eyes: Negative.  Respiratory: Negative.   Cardiovascular: Negative.   Gastrointestinal: Negative.   Genitourinary: Negative.   Musculoskeletal: Negative.   Skin: Negative.   Neurological: Negative.   Endo/Heme/Allergies: Negative.     Blood pressure 111/75, pulse (!) 106, temperature 98.1 F (36.7 C), temperature source Oral, resp. rate 18, height 5' 7.5" (1.715 m), weight 72.5 kg (159 lb 12.8 oz), SpO2 99 %.Body mass index is 24.66 kg/m.  General Appearance: Casual  Eye Contact:  Good  Speech:  Clear and Coherent and Normal Rate  Volume:  Normal  Mood:  Depressed  Affect:  Flat  Thought Process:  Coherent and Descriptions of Associations: Intact  Orientation:  Full (Time, Place, and Person)  Thought Content:  WDL  Suicidal Thoughts:  No  Homicidal Thoughts:  No  Memory:  Immediate;   Good Recent;   Good  Judgement:  Fair  Insight:  Good  Psychomotor Activity:  Normal  Concentration:  Concentration: Good and Attention Span: Good  Recall:  Good  Fund of Knowledge:  Good  Language:  Good  Akathisia:  No  Handed:  Right  AIMS (if indicated):     Assets:  Social Support  ADL's:  Intact  Cognition:  WNL  Sleep:  Number of Hours: 6     Treatment Plan Summary: Daily contact with patient to assess and evaluate symptoms and progress in treatment, Medication management and Plan is to:  -Continue Abilify 15 mg PO  Daily for mood stability -Continue Prozac 40 mg PO Daily for mood stability -Continue Hydroxyzine PRN for anxiety -Continue Mirtazapine 7.5 mg PO QHS for mood stability -Continue Prazosin 2 mg PO QHS for PTSD -Continue Trazodone QHS PRN for insomnia -Encourage group therapy participation  Lewis Shock, FNP 07/17/2017, 12:25 PM   I have discussed case with NP , staff and have met with patient. Mr. Varricchio reports increased anxiety and depression related to finding out a friend was recently murdered. He states he has also felt agitated by a peer who has been monopolizing groups , but denies any current suicidal or self injurious ideations , and denies any violent ideations. Behavior on unit in good control. Today he reports increased pain on his L arm which is fractured and currently casted. States " it is swelling inside the cast and I feel it pushing against it". He reports his fingers are tingling .  There is no visible increased inflammation and distal/finger mobility seems preserved, with adequate capillary filling . Denies medication side effects.  Plan - continue medications - will send patient to ED for work up regarding increased arm pain and discomfort .  Gabriel Earing MD

## 2017-07-17 NOTE — ED Notes (Signed)
[facility] Expected date:  Expected time:  Means of arrival:  [facility]

## 2017-07-17 NOTE — [facility] Group Notes (Signed)
[facility] Dis[facility] Planning [facility] Note  07/17/2017 8:45 AM  Participation Quality: Pt did not attend; sleeping in room  Vernie ShanksLauren Kilynn Fitzsimmons, LCSW 07/17/2017 11:38 AM

## 2017-07-17 NOTE — Progress Notes (Signed)
Pt rated his day a 7 out of 10. Pt goal is to go home tomorrow.

## 2017-07-17 NOTE — ED Notes (Signed)
Ortho tech at bedside 

## 2017-07-17 NOTE — Progress Notes (Signed)
Recreation Therapy Notes  Date: 07/17/2017 Time: 9:30am Location: [address] Hall Dayroom  Group Topic: Stress Management  Goal Area(s) Addresses:  Patient will verbalize importance of using healthy stress management.  Patient will identify positive emotions associated with healthy stress management.   Intervention: Stress Management  Activity :  Guided Body Scan. Recreation Therapy Intern introduced the stress management technique of guided body scans. Recreation Therapy Intern played a YouTube video that allowed patients to focus on the tension built up in each part of their body. Patients were to follow along as script was read to engage in the activity.  Education: Stress Management, Discharge Planning.   Education Outcome: Acknowledges edcuation  Clinical Observations/Feedback: Pt did not attend group.  Rachel Meyer, Recreation Therapy Intern    Alanna Storti, LRT/CTRS   

## 2017-07-17 NOTE — ED Provider Notes (Signed)
[facility]-EMER[facility]ENCY DEPT Provider Note   CSN: [id number] Arrival date & time: 07/12/17  1236     History   Chief Complaint No chief complaint on file.   HPI Edward Stone is a 31 y.o. male.  HPI   31 yo RHD male here with left arm cast pain. [facility] tx of schizoaffective disorder. He sustained a left wrist/multiple finger fx on 7/11 and [facility]. He was scheduled for cast removal on 8/3 but [facility]. Per his report, he lifted his hand up yesterday and felt a pulling sensation in his cast. He has since had a tightness/swelling sensation in his forearm with occasional tingling in his hands. Denies any new trauma. Denies any new weakness. No bleeding.  Past Medical History:  Diagnosis Date  . Anxiety   . Asthma     Patient Active Problem List   Diagnosis Date Noted  . PTSD (post-traumatic stress disorder)   . Schizoaffective disorder (HCC) 07/13/2017  . MDD (major depressive disorder) 07/12/2017  . Severe recurrent major depressive disorder with psychotic features (HCC) 06/28/2016  . Suicidal behavior 06/23/2016    History reviewed. No pertinent surgical history.     Home Medications    Prior to Admission medications   Medication Sig Start Date End Date Taking? Authorizing Provider  ARIPiprazole (ABILIFY) 2 MG tablet Take 1 tablet (2 mg total) by mouth at bedtime. Patient taking differently: Take 2 mg by mouth 3 (three) times daily.  07/02/16  Yes Oneta RackLewis, Tanika N, NP  diclofenac (VOLTAREN) 50 MG EC tablet Take 50 mg by mouth 3 (three) times daily.   Yes [provider]  FLUoxetine (PROZAC) 20 MG capsule Take 1 capsule (20 mg total) by mouth daily. Patient taking differently: Take 40 mg by mouth daily.  07/02/16  Yes Oneta RackLewis, Tanika N, NP  gabapentin (NEURONTIN) 600 MG tablet Take 600 mg by mouth 3 (three) times daily.   Yes [provider]  lisinopril (PRINIVIL,ZESTRIL) 10 MG tablet Take 1 tablet (10 mg total) by mouth  daily. 07/02/16  Yes Oneta RackLewis, Tanika N, NP  mirtazapine (REMERON) 15 MG tablet Take 15 mg by mouth at bedtime.   Yes [provider]  oxyCODONE (OXY IR/ROXICODONE) 5 MG immediate release tablet Take 10 mg by mouth every 6 (six) hours as needed for severe pain.   Yes [provider]  prazosin (MINIPRESS) 1 MG capsule Take 1 capsule (1 mg total) by mouth at bedtime. 07/02/16  Yes Oneta RackLewis, Tanika N, NP  topiramate (TOPAMAX) 25 MG tablet Take 1 tablet (25 mg total) by mouth daily. 07/02/16  Yes Oneta RackLewis, Tanika N, NP  traZODone (DESYREL) 100 MG tablet Take 1 tablet (100 mg total) by mouth at bedtime. Patient taking differently: Take 150 mg by mouth at bedtime.  07/02/16  Yes Oneta RackLewis, Tanika N, NP  doxycycline (VIBRAMYCIN) 100 MG capsule Take 1 capsule (100 mg total) by mouth 2 (two) times daily. Patient not taking: Reported on 07/12/2017 07/01/16   Fayrene Helperran, Bowie, PA-C  gabapentin (NEURONTIN) 400 MG capsule Take 1 capsule (400 mg total) by mouth 3 (three) times daily. Patient not taking: Reported on 07/12/2017 07/02/16   Oneta RackLewis, Tanika N, NP  hydrOXYzine (ATARAX/VISTARIL) 50 MG tablet Take 1 tablet (50 mg total) by mouth every 6 (six) hours as needed for anxiety. Patient not taking: Reported on 07/12/2017 07/02/16   Oneta RackLewis, Tanika N, NP  ibuprofen (ADVIL,MOTRIN) 800 MG tablet Take 1 tablet (800 mg total) by  mouth every 8 (eight) hours as needed for moderate pain. Patient not taking: Reported on 07/12/2017 07/01/16   Fayrene Helper, PA-C  mometasone-formoterol [facility]) 100-5 MCG/ACT AERO Inhale 2 puffs into the lungs 2 (two) times daily. Patient not taking: Reported on 07/12/2017 07/02/16   Oneta Rack, NP  nicotine (NICODERM CQ - DOSED IN MG/24 HOURS) 21 mg/24hr patch Place 1 patch (21 mg total) onto the skin daily. Patient not taking: Reported on 07/12/2017 07/02/16   Oneta Rack, NP    Family History History reviewed. No pertinent family history.  Social History Social History  Substance Use Topics  .  Smoking status: Current Every Day Smoker    Packs/day: 1.00    Types: Cigarettes  . Smokeless tobacco: Current User    Types: Chew  . Alcohol use Yes     Allergies   Patient has no known allergies.   Review of Systems Review of Systems  Constitutional: Negative for chills, fatigue and fever.  HENT: Negative for congestion and rhinorrhea.   Eyes: Negative for visual disturbance.  Respiratory: Negative for cough, shortness of breath and wheezing.   Cardiovascular: Negative for chest pain and leg swelling.  Gastrointestinal: Negative for abdominal pain, diarrhea, nausea and vomiting.  Genitourinary: Negative for dysuria and flank pain.  Musculoskeletal: Positive for arthralgias and myalgias. Negative for neck pain and neck stiffness.  Skin: Negative for rash and wound.  Allergic/Immunologic: Negative for immunocompromised state.  Neurological: Positive for numbness. Negative for syncope, weakness and headaches.  All other systems reviewed and are negative.    Physical Exam Updated Vital Signs BP (!) 130/93 (BP Location: Right Arm)   Pulse 78   Temp 98.3 F (36.8 C) (Oral)   Resp 20   Ht 5\' 11"  (1.803 m)   Wt 72.6 kg (160 lb)   SpO2 98%   BMI 22.32 kg/m   Physical Exam  Constitutional: He is oriented to person, place, and time. He appears well-developed and well-nourished. No distress.  HENT:  Head: Normocephalic and atraumatic.  Eyes: Conjunctivae are normal.  Neck: Neck supple.  Cardiovascular: Normal rate, regular rhythm and normal heart sounds.   Pulmonary/Chest: Effort normal. No respiratory distress. He has no wheezes.  Abdominal: He exhibits no distension.  Musculoskeletal: He exhibits no edema.  Neurological: He is alert and oriented to person, place, and time. He exhibits normal muscle tone.  Skin: Skin is warm. Capillary refill takes less than 2 seconds. No rash noted.  Nursing note and vitals reviewed.   UPPER EXTREMITY EXAM: LEFT  INSPECTION &  PALPATION: Left wrist in short arm cast. Minimal edema of exposed fingers. Mild TTP with pROM of fingers, no deformity. No cyanosis.   SENSORY: Sensation is intact to light touch in:  Superficial radial nerve distribution (dorsal first web space) Median nerve distribution (tip of index finger)   Ulnar nerve distribution (tip of small finger)     MOTOR:  + Motor posterior interosseous nerve (thumb IP extension) + Anterior interosseous nerve (thumb IP flexion, index finger DIP flexion) + Radial nerve (wrist extension) + Median nerve (palpable firing thenar mass) + Ulnar nerve (palpable firing of first dorsal interosseous muscle)  VASCULAR: 2+ radial pulse Brisk capillary refill < 2 sec, fingers warm and well-perfused   ED Treatments / Results  Labs (all labs ordered are listed, but only abnormal results are displayed) Labs Reviewed  URINALYSIS, ROUTINE W REFLEX MICROSCOPIC - Abnormal; Notable for the following:       Result Value  Color, Urine STRAW (*)    Specific Gravity, Urine 1.004 (*)    Leukocytes, UA LARGE (*)    All other components within normal limits  RAPID URINE DRUG SCREEN, HOSP PERFORMED - Abnormal; Notable for the following:    Tetrahydrocannabinol POSITIVE (*)    All other components within normal limits  HEMOGLOBIN A1C  LIPID PANEL  TSH  RPR  GC/CHLAMYDIA PROBE AMP ([place]) NOT AT [facility]    EKG  EKG Interpretation None       Radiology Dg Wrist Complete Left  Result Date: 07/17/2017 CLINICAL DATA:  History of fixation of wrist and first metacarpal fractures suffered in a fall 06/16/2017. Subsequent encounter. EXAM: LEFT HAND - COMPLETE 3+ VIEW; LEFT WRIST - COMPLETE 3+ VIEW COMPARISON:  Plain films of the left wrist 06/16/2017. FINDINGS: Since the prior examination, the patient has undergone fixation of a second metacarpal fracture with single fixation wire in place. Plate and screws fixing a distal radius fracture are also identified. Single pin and  cerclage wire fix an ulnar styloid fracture. Hardware is intact. Fracture lines remain visible. No acute abnormality. IMPRESSION: Status post fixation of distal radius, ulnar and second metacarpal fractures. No acute finding. Electronically Signed   By: Drusilla Kanner M.D.   On: 07/17/2017 17:08   Dg Hand Complete Left  Result Date: 07/17/2017 CLINICAL DATA:  History of fixation of wrist and first metacarpal fractures suffered in a fall 06/16/2017. Subsequent encounter. EXAM: LEFT HAND - COMPLETE 3+ VIEW; LEFT WRIST - COMPLETE 3+ VIEW COMPARISON:  Plain films of the left wrist 06/16/2017. FINDINGS: Since the prior examination, the patient has undergone fixation of a second metacarpal fracture with single fixation wire in place. Plate and screws fixing a distal radius fracture are also identified. Single pin and cerclage wire fix an ulnar styloid fracture. Hardware is intact. Fracture lines remain visible. No acute abnormality. IMPRESSION: Status post fixation of distal radius, ulnar and second metacarpal fractures. No acute finding. Electronically Signed   By: Drusilla Kanner M.D.   On: 07/17/2017 17:08    Procedures Procedures (including critical care time)  Medications Ordered in ED Medications  acetaminophen (TYLENOL) tablet 650 mg (650 mg Oral Given 07/17/17 1156)  alum & mag hydroxide-simeth (MAALOX/MYLANTA) 200-200-20 MG/5ML suspension 30 mL (not administered)  magnesium hydroxide (MILK OF MAGNESIA) suspension 30 mL (not administered)  traZODone (DESYREL) tablet 50 mg (50 mg Oral Given 07/17/17 2128)  nicotine (NICODERM CQ - dosed in mg/24 hours) patch 21 mg (21 mg Transdermal Patch Applied 07/17/17 0809)  lisinopril (PRINIVIL,ZESTRIL) tablet 10 mg (10 mg Oral Given 07/17/17 0809)  ibuprofen (ADVIL,MOTRIN) tablet 800 mg (800 mg Oral Given 07/17/17 2000)  hydrOXYzine (ATARAX/VISTARIL) tablet 50 mg (50 mg Oral Given 07/17/17 2000)  mirtazapine (REMERON) tablet 7.5 mg (7.5 mg Oral Given 07/17/17 2128)    albuterol (PROVENTIL HFA;VENTOLIN HFA) 108 (90 Base) MCG/ACT inhaler 1-2 puff (2 puffs Inhalation Given 07/17/17 0613)  prazosin (MINIPRESS) capsule 2 mg (2 mg Oral Given 07/17/17 2128)  gabapentin (NEURONTIN) capsule 200 mg (200 mg Oral Given 07/17/17 1730)  mometasone-formoterol (DULERA) 100-5 MCG/ACT inhaler 2 puff (2 puffs Inhalation Given 07/17/17 2001)  FLUoxetine (PROZAC) capsule 40 mg (40 mg Oral Given 07/17/17 0808)  LORazepam (ATIVAN) 1 MG tablet (  Not Given 07/15/17 1647)  ARIPiprazole (ABILIFY) tablet 15 mg (15 mg Oral Given 07/17/17 0808)  ARIPiprazole (ABILIFY) tablet 2 mg (2 mg Oral Given 07/12/17 1547)  hydrOXYzine (ATARAX/VISTARIL) 50 MG tablet (0 mg  Duplicate 07/12/17  2100)  LORazepam (ATIVAN) tablet 1 mg (1 mg Oral Given 07/15/17 1630)  prazosin (MINIPRESS) capsule 2 mg (2 mg Oral Given 07/15/17 2122)     Initial Impression / Assessment and Plan / ED Course  I have reviewed the triage vital signs and the nursing notes.  Pertinent labs & imaging results that were available during my care of the patient were reviewed by me and considered in my medical decision making (see chart for details).     31 yo RHD male here with pain at left cast site s/p ORIF 7/11 at [facility]. Pt due to have cast removed on 8/3 but is in [facility] at this time. I called and discussed findings, exam with Dr. Dareen Piano of [facility]. Will remove cast, place in soft volar wrist and d/c with outpt follow-up. XRays show no obvious displacement. Following removal, examination of surgical sites shows that incisions are c/d/i, with no drainage, no erythema. Wounds appear to be healing well. There is no obvious remaining deformity. Distal NV is at baseline with no signs of compartment syndrome. He should remain NWB in splint with outpt follow-up.  Final Clinical Impressions(s) / ED Diagnoses   Final diagnoses:  Cast removal    New Prescriptions Current Discharge Medication List       Shaune Pollack, MD 07/17/17  2303

## 2017-07-17 NOTE — ED Notes (Signed)
[facility].  Pt to transfer back.   [place] notified.

## 2017-07-17 NOTE — Progress Notes (Addendum)
Pt complains of fullness in his cast "possible swelling" and numbness to his left arm and hand. Pt capillary refill less than 2 sec. No discoloration noted. Per Dr. Jama Fl[facility]sobos [facility]. Per MD [facility]. Report called to Surveyor, miningMartha charge nurse [facility]. Pt transported per [place] with sitter.

## 2017-07-17 NOTE — Progress Notes (Addendum)
Pt discharged by Otto Herbreka, RN at [phone]

## 2017-07-17 NOTE — Progress Notes (Signed)
D-pt seems to be attention seeking and has high anxiety, pt seems to flirt with the females on the unit A-pt attended group and took her pm meds; pt cont. Asked for more anti anxiety medication; pt asked for ativan or klonopin several times, Barbara CowerJason NP notified R-cont to monitor for safety
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
## 2017-07-17 NOTE — Progress Notes (Signed)
D: Pt presents with an animated affect and anxious mood. Pt rates depression 7/10. Anxiety 9/10. Pt reports decreased SI today and denies active suicidal thoughts.  Pt denies VH but continues to endorse AH telling him to kill himself. Pt verbally contracts for safety. Pt stated that he's considering long-term substance abuse tx. Pt reports poor sleep last night due to nightmares and racing thoughts. Dr. Jama Flavorsobos made aware of pt complaints in treatment tx. A: Medications administered as ordered per MD. Medications reviewed with pt. Verbal support provided. Pt encouraged to attend groups. 15 minute checks performed for safety. R: Pt compliant with tx.

## 2017-07-17 NOTE — Progress Notes (Signed)
D: Pt was in the hallway upon initial approach.  Pt presents with anxious affect and mood.  Goal is to "try not to let my anxiety get overwhelming."  Pt denies SI/HI, reports AH of "voices saying just different things, kind of like talking to me but not telling me to do things."  Pt reports L arm pain of 7/10.  Pt has been visible in milieu interacting with peers and staff appropriately.  Pt attended evening group.    A: Introduced self to pt.  Actively listened to pt and offered support and encouragement. Medications administered per order.  PRN medication administered for pain, anxiety, and sleep.  Q15 minute safety checks maintained.  R: Pt is safe on the unit.  Pt is compliant with medications.  Pt verbally contracts for safety.  Will continue to monitor and assess.

## 2017-07-17 NOTE — Plan of Care (Signed)
Problem: Health Behavior/Discharge Planning: Goal: Compliance with prescribed medication regimen will improve Outcome: Progressing Pt has been compliant with all medications tonight.

## 2017-07-18 MED ORDER — FLUOXETINE HCL 40 MG PO CAPS: 40.0000 mg | ORAL_CAPSULE | Freq: Every day | ORAL | 0 refills | Status: DC

## 2017-07-18 MED ORDER — GABAPENTIN 100 MG PO CAPS: 200.0000 mg | ORAL_CAPSULE | Freq: Three times a day (TID) | ORAL | 0 refills | Status: DC

## 2017-07-18 MED ORDER — HYDROXYZINE HCL 50 MG PO TABS: 50.0000 mg | ORAL_TABLET | Freq: Four times a day (QID) | ORAL | 0 refills | Status: DC | PRN

## 2017-07-18 MED ORDER — NICOTINE 21 MG/24HR TD PT24: 21.0000 mg | MEDICATED_PATCH | Freq: Every day | TRANSDERMAL | 0 refills | Status: DC

## 2017-07-18 MED ORDER — MIRTAZAPINE 7.5 MG PO TABS: 7.5000 mg | ORAL_TABLET | Freq: Every day | ORAL | 0 refills | Status: DC

## 2017-07-18 MED ORDER — TRAZODONE HCL 50 MG PO TABS: 50.0000 mg | ORAL_TABLET | Freq: Every evening | ORAL | 0 refills | Status: DC | PRN

## 2017-07-18 MED ORDER — ARIPIPRAZOLE 15 MG PO TABS: 15.0000 mg | ORAL_TABLET | Freq: Every day | ORAL | 0 refills | Status: DC

## 2017-07-18 MED ORDER — PRAZOSIN HCL 2 MG PO CAPS: 2.0000 mg | ORAL_CAPSULE | Freq: Every day | ORAL | 0 refills | Status: DC

## 2017-07-18 NOTE — [facility] Suicide Risk Assessment (Signed)
[facility] Discharge Suicide Risk Assessment   Principal Problem: Schizoaffective disorder [facility]) Discharge Diagnoses:  Patient Active Problem List   Diagnosis Date Noted  . PTSD (post-traumatic stress disorder) [F43.10]   . Schizoaffective disorder (HCC) [F25.9] 07/13/2017  . MDD (major depressive disorder) [F32.9] 07/12/2017  . 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 Suicidal behavior [R46.89] 06/23/2016    Total Time spent with patient: 30 minutes  Musculoskeletal: Strength & Muscle Tone: within normal limits Gait & Station: normal Patient leans: N/A  Psychiatric Specialty Exam: ROS no headache, no chest pain, no shortness of breath, no vomiting, states R forearm pain is much improved compared to before, after casting was changed yesterday. No fever, no chills   Blood pressure 127/81, pulse (!) 116, temperature 98.4 F (36.9 C), temperature source Oral, resp. rate 16, height 5\' 11"  (1.803 m), weight 72.6 kg (160 lb), SpO2 98 %.Body mass index is 22.32 kg/m.  General Appearance: Well Groomed  Eye Contact::  Good  Speech:  Normal Rate409  Volume:  Normal  Mood:  reports mood is improved,  denies depression today and describes mood as " 8/10"  Affect:  Appropriate and full in range, calm today  Thought Process:  Linear and Descriptions of Associations: Intact  Orientation:  Full (Time, Place, and Person)  Thought Content:  no delusions expressed, reports intermittent hallucinations ( children crying ) but denies any current hallucinations and does not appear internally preoccupied, no delusions are expressed   Suicidal Thoughts:  No denies any suicidal or self injurious ideations, denies homicidal or violent ideations  Homicidal Thoughts:  No  Memory:  recent and remote grossly intact   Judgement:  Other:  improving   Insight:  fair- improving   Psychomotor Activity:  Normal- presents calm, comfortable,  in no acute distress   Concentration:  Good  Recall:  Good  Fund of Knowledge:Good  Language: Good  Akathisia:  Negative  Handed:  Right  AIMS (if indicated):   no abnormal or involuntary movements noted or reported   Assets:  Communication Skills Desire for Improvement Resilience  Sleep:  Number of Hours: 6.75  Cognition: WNL  ADL's:  Intact   Mental Status Per Nursing Assessment::   On Admission:  Suicidal ideation indicated by patient  Demographic Factors:  31 year old male, lives alone in [place], currently unemployed   Loss Factors: History of fiance/child loss 2009, unemployment, history of recent forearm and facial fractures ( 7/6) , recent relapse  Historical Factors: Prior psychiatric admissions, history of depression and of PTSD symptoms, history of alcohol and cocaine abuse in the past  Risk Reduction Factors:   Positive social support and Positive coping skills or problem solving skills  Continued Clinical Symptoms:  At this time patient presents alert, attentive,well related,pleasant, calm. He is well groomed, speech normal, mood is described as improved and states " today is the best I have felt so far ", affect is more reactive, pleasant on approach, no thought disorder, no suicidal or self injurious ideations, no homicidal or violent ideations, has described history of intermittent auditory hallucinations, but denies any at this time, and does not present internally preoccupied, no delusions are expressed, future oriented, states he has applied for disability,and reports he is going to follow up at [facility] as he may need a second surgery for his forearm fracture. Of note, yesterday had reported increased hand , forearm pain, went to ED and cast was changed, he states he  has much relief since then and pain currently under control- presents calm, comfortable, in no acute distress . Denies medication side effects.   Cognitive Features That Contribute To Risk:  No gross  cognitive deficits noted upon discharge. Is alert , attentive, and oriented x 3   Suicide Risk:  Mild:  Suicidal ideation of limited frequency, intensity, duration, and specificity.  There are no identifiable plans, no associated intent, mild dysphoria and related symptoms, good self-control (both objective and subjective assessment), few other risk factors, and identifiable protective factors, including available and accessible social support.  Follow-up Information    [facility] Follow up on 07/20/2017.   Why:  Hospital discharge appointment 8/9 at 1pm.  Contact information: [address]  [place] [address] [phone]        [facility]. Go on 07/18/2017.   Specialty:  Rehabilitation Why:  You have a follow up appointment scheduled with Dr. Wyvonnia LoraNunez on Tuesday 07/18/17 at 2pm. You will also have XRays completed at this appointment. Phone: [phone].  Contact information: [address] [place] [address] [phone]           Plan Of Care/Follow-up recommendations:  Activity:  as tolerated  Diet:  regular Tests:  NA Other:  See below Patient expresses readiness for discharge, and there are no current grounds for involuntary commitment . Plans to return home and [facility]. Plans to [facility] , as above . Encouraged to continue focusing on early recovery, relapse prevention and to avoid people, places and situations he associates with drug abuse in order to minimize risk of relapse.  Craige CottaFernando A Keva Darty, MD 07/18/2017, 10:07 AM

## 2017-07-18 NOTE — [facility] Suicide Risk Assessment (Signed)
[facility] INPATIENT:  Family/Significant Other Suicide Prevention Education  Suicide Prevention Education:  Education Completed; Marcelino Duster([facility] [phone]), has been identified by the patient as the family member/significant other with whom the patient will be residing, and identified as the person(s) who will aid the patient in the event of a mental health crisis (suicidal ideations/suicide attempt).  With written consent from the patient, the family member/significant other has been provided the following suicide prevention education, prior to the and/or following the discharge of the patient.  The suicide prevention education provided includes the following:  Suicide risk factors  Suicide prevention and interventions  National Suicide Hotline telephone number  [facility] assessment telephone number  [facility] Emergency Assistance 911  [facility] and/or Residential Mobile Crisis Unit telephone number  Request made of family/significant other to:  Remove weapons (e.g., guns, rifles, knives), all items previously/currently identified as safety concern.    Remove drugs/medications (over-the-counter, prescriptions, illicit drugs), all items previously/currently identified as a safety concern.  The family member/significant other verbalizes understanding of the suicide prevention education information provided.  The family member/significant other agrees to remove the items of safety concern listed above.  Pt's girlfriend states that she believes pt is currently stable for discharge and she has no concerns about him leaving today. Pt's girlfriend confirms that pt does not have access to guns at home.  Jonathon JordanLynn B Eaden Hettinger, MSW, LCSWA 07/18/2017, 11:01 AM
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
## 2017-07-18 NOTE — Progress Notes (Signed)
  [facility] Discharge Plan :  Will you be returning to the same living situation after discharge:  Yes,  pt returning home. At discharge, do you have transportation home?: Yes,  pt's aunt will transport. Do you have the ability to pay for your medications: Yes,  prescriptions and samples provided.  Release of information consent forms completed and in the chart;  Patient's signature needed at discharge.  Patient to Follow up at: Follow-up Information    [facility] Follow up on 07/20/2017.   Why:  Hospital discharge appointment 8/9 at 1pm.  Contact information: [address] [place] [address] [phone]        [facility] Go on 07/18/2017.   Specialty:  Rehabilitation Why:  You have a follow up appointment scheduled with Dr. Wyvonnia LoraNunez on Tuesday 07/18/17 at 2pm. You will also have XRays completed at this appointment. Phone: [phone].  Contact information: [address] [place] [address] [phone]           Next level of care provider has access to [facility] Link:no  Safety Planning and Suicide Prevention discussed: Yes,  with pt and with pt's girlfriend.  Have you used any form of tobacco in the last 30 days? (Cigarettes, Smokeless Tobacco, Cigars, and/or Pipes): Yes  Has patient been referred to the Quitline?: Patient refused referral  Patient has been referred for addiction treatment: Yes  Jonathon JordanLynn B Rinaldo Macqueen, MSW, LCSWA 07/18/2017, 1:52 PM

## 2017-07-18 NOTE — Progress Notes (Signed)
Discharge D-Patient verbalizes readiness for discharge: Denies SI/HI. A- Discharge instructions read and discussed with patient.  All belongings returned to patient to include wallet, cell phone, jacket, cigarettes, and lighter. See belongings sheet. R- Patient cooperative with discharge process.  Patient verbalized understanding of discharge instructions.  Signed for return of belongings. Escorted to the lobby for discharge.

## 2018-05-21 ENCOUNTER — Encounter (HOSPITAL_COMMUNITY): Payer: Self-pay

## 2018-05-21 ENCOUNTER — Other Ambulatory Visit: Payer: Self-pay

## 2018-05-21 ENCOUNTER — Inpatient Hospital Stay (HOSPITAL_COMMUNITY)
Admission: RE | Admit: 2018-05-21 | Discharge: 2018-05-28 | DRG: 885 | Disposition: A | Discharge status: Home / Self Care | Payer: Federal, State, Local not specified - Other | Attending: Psychiatry | Admitting: Psychiatry

## 2018-05-21 DIAGNOSIS — F4310 Post-traumatic stress disorder, unspecified: Secondary | ICD-10-CM | POA: Diagnosis present

## 2018-05-21 DIAGNOSIS — F419 Anxiety disorder, unspecified: Secondary | ICD-10-CM | POA: Diagnosis not present

## 2018-05-21 DIAGNOSIS — F333 Major depressive disorder, recurrent, severe with psychotic symptoms: Principal | ICD-10-CM | POA: Diagnosis present

## 2018-05-21 DIAGNOSIS — R450 Nervousness: Secondary | ICD-10-CM | POA: Diagnosis not present

## 2018-05-21 DIAGNOSIS — Z653 Problems related to other legal circumstances: Secondary | ICD-10-CM

## 2018-05-21 DIAGNOSIS — F251 Schizoaffective disorder, depressive type: Secondary | ICD-10-CM | POA: Diagnosis not present

## 2018-05-21 DIAGNOSIS — R45851 Suicidal ideations: Secondary | ICD-10-CM | POA: Diagnosis present

## 2018-05-21 DIAGNOSIS — R454 Irritability and anger: Secondary | ICD-10-CM | POA: Diagnosis not present

## 2018-05-21 DIAGNOSIS — Z818 Family history of other mental and behavioral disorders: Secondary | ICD-10-CM | POA: Diagnosis not present

## 2018-05-21 DIAGNOSIS — F17210 Nicotine dependence, cigarettes, uncomplicated: Secondary | ICD-10-CM | POA: Diagnosis present

## 2018-05-21 DIAGNOSIS — G4700 Insomnia, unspecified: Secondary | ICD-10-CM | POA: Diagnosis present

## 2018-05-21 DIAGNOSIS — F332 Major depressive disorder, recurrent severe without psychotic features: Secondary | ICD-10-CM | POA: Diagnosis present

## 2018-05-21 LAB — COMPREHENSIVE METABOLIC PANEL
ALK PHOS: 82 U/L (ref 38–126)
ALT: 14 U/L — AB (ref 17–63)
AST: 20 U/L (ref 15–41)
Albumin: 4.6 g/dL (ref 3.5–5.0)
Anion gap: 9 (ref 5–15)
BILIRUBIN TOTAL: 0.5 mg/dL (ref 0.3–1.2)
BUN: 9 mg/dL (ref 6–20)
CALCIUM: 9.5 mg/dL (ref 8.9–10.3)
CO2: 30 mmol/L (ref 22–32)
CREATININE: 1.29 mg/dL — AB (ref 0.61–1.24)
Chloride: 103 mmol/L (ref 101–111)
GFR calc non Af Amer: >60 mL/min (ref >60)
GLUCOSE: 54 mg/dL — AB (ref 65–99)
Potassium: 3.6 mmol/L (ref 3.5–5.1)
SODIUM: 142 mmol/L (ref 135–145)
TOTAL PROTEIN: 7.7 g/dL (ref 6.5–8.1)

## 2018-05-21 LAB — TSH: TSH: 2.126 u[IU]/mL (ref 0.350–4.500)

## 2018-05-21 MED ORDER — OLANZAPINE 5 MG PO TBDP
5.0000 mg | ORAL_TABLET | Freq: Three times a day (TID) | ORAL | Status: DC | PRN
Start: 2018-05-21 — End: 2018-05-22
  Administered 2018-05-21: 5 mg via ORAL
  Filled 2018-05-21: qty 1

## 2018-05-21 MED ORDER — ALUM & MAG HYDROXIDE-SIMETH 200-200-20 MG/5ML PO SUSP: 30.0000 mL | ORAL | Status: DC | PRN

## 2018-05-21 MED ORDER — TRAZODONE HCL 50 MG PO TABS
50.0000 mg | ORAL_TABLET | Freq: Every evening | ORAL | Status: DC | PRN
  Administered 2018-05-21 – 2018-05-22 (×2): 50 mg via ORAL
  Filled 2018-05-21 (×2): qty 1

## 2018-05-21 MED ORDER — HYDROXYZINE HCL 25 MG PO TABS
25.0000 mg | ORAL_TABLET | Freq: Four times a day (QID) | ORAL | Status: DC | PRN
  Administered 2018-05-22 – 2018-05-27 (×5): 25 mg via ORAL
  Filled 2018-05-21 (×4): qty 1
  Filled 2018-05-21: qty 10
  Filled 2018-05-21: qty 1

## 2018-05-21 MED ORDER — ALBUTEROL SULFATE HFA 108 (90 BASE) MCG/ACT IN AERS
2.0000 | INHALATION_SPRAY | RESPIRATORY_TRACT | Status: DC | PRN
  Administered 2018-05-22 – 2018-05-27 (×13): 2 via RESPIRATORY_TRACT
  Filled 2018-05-21: qty 6.7

## 2018-05-21 MED ORDER — MAGNESIUM HYDROXIDE 400 MG/5ML PO SUSP: 30.0000 mL | Freq: Every day | ORAL | Status: DC | PRN

## 2018-05-21 MED ORDER — ACETAMINOPHEN 325 MG PO TABS
650.0000 mg | ORAL_TABLET | Freq: Four times a day (QID) | ORAL | Status: DC | PRN
  Administered 2018-05-21 – 2018-05-27 (×4): 650 mg via ORAL
  Filled 2018-05-21 (×4): qty 2

## 2018-05-21 MED ORDER — NICOTINE 21 MG/24HR TD PT24
21.0000 mg | MEDICATED_PATCH | Freq: Every day | TRANSDERMAL | Status: DC
  Administered 2018-05-21 – 2018-05-28 (×8): 21 mg via TRANSDERMAL
  Filled 2018-05-21 (×11): qty 1

## 2018-05-21 NOTE — Tx Team (Signed)
Initial Treatment Plan 05/21/2018 4:46 PM Edward Stone [id number]    PATIENT STRESSORS: Financial difficulties Medication change or noncompliance   PATIENT STRENGTHS: Ability for insight Communication skills   PATIENT IDENTIFIED PROBLEMS: Medications are not working   I am hearing voices and seeing things                    DISCHARGE CRITERIA:  Improved stabilization in mood, thinking, and/or behavior Verbal commitment to aftercare and medication compliance  PRELIMINARY DISCHARGE PLAN: Return to previous living arrangement  PATIENT/FAMILY INVOLVEMENT: This treatment plan has been presented to and reviewed with the patient, Edward Stone, and/or family member.  The patient and family have been given the opportunity to ask questions and make suggestions.  Edward BushyLaRonica R Andrea Ferrer, RN 05/21/2018, 4:46 PM

## 2018-05-21 NOTE — Progress Notes (Signed)
Patient ID: Edward Stone, male   DOB: [birth year], 32 y.o.   MRN: [id number] Patient admitted to the unit for increased depression and SI.  Patient states his medications have not been working and he is having increased symptoms of his illness to include increased depression, suicidal ideations, auditory and visual hallucinations.   Patient was free of all injury patient was noted to have a tattoo on his chest and on his buttock.  Patient free of all contraband and admitted to the unit without incident.

## 2018-05-21 NOTE — H&P (Signed)
Behavioral Health Medical Screening Exam  Edward Stone is an 32 y.o. male who presents as a walk in due to auditory hallucinations and suicidal thoughts with a plan. Patient reports a plan "to hang myself. The voices encourage me. My psychiatric medications have not been working for a while now. I am feeling very depressed. I need to be admitted." Patient accepted to [facility] for inpatient psychiatric treatment.   Total Time spent with patient: 20 minutes  Psychiatric Specialty Exam: Physical Exam  Constitutional: He is oriented to person, place, and time. He appears well-developed and well-nourished.  HENT:  Head: Normocephalic.  Neck: Normal range of motion.  Cardiovascular: Normal rate, regular rhythm, normal heart sounds and intact distal pulses.  Respiratory: Effort normal and breath sounds normal.  GI: Soft. Bowel sounds are normal.  Musculoskeletal: Normal range of motion.  Neurological: He is alert and oriented to person, place, and time.  Skin: Skin is warm and dry.    Review of Systems  Constitutional: Negative for chills, fever and weight loss.  Eyes: Negative for blurred vision and double vision.  Respiratory: Negative for cough, hemoptysis and sputum production.   Cardiovascular: Negative for chest pain, palpitations and orthopnea.  Gastrointestinal: Negative for abdominal pain, diarrhea, heartburn, nausea and vomiting.  Genitourinary: Negative for dysuria, frequency and urgency.  Neurological: Negative for dizziness, tingling, tremors, sensory change, speech change and headaches.  Psychiatric/Behavioral: Positive for depression, hallucinations and suicidal ideas. Negative for substance abuse. The patient is nervous/anxious.     Blood pressure 126/88, pulse 72, temperature 98.3 F (36.8 C), resp. rate 18.There is no height or weight on file to calculate BMI.  General Appearance: Disheveled  Eye Contact:  Fair  Speech:  Clear and Coherent  Volume:  Decreased  Mood:   Depressed  Affect:  Blunt  Thought Process:  Coherent and Goal Directed  Orientation:  Full (Time, Place, and Person)  Thought Content:  Symptoms, worries, concerns  Suicidal Thoughts:  Yes.  with intent/plan  Homicidal Thoughts:  No  Memory:  Immediate;   Good Recent;   Good Remote;   Good  Judgement:  Fair  Insight:  Present  Psychomotor Activity:  Decreased  Concentration: Concentration: Fair and Attention Span: Fair  Recall:  [employer]  Fund of Knowledge:Fair  Language: Good  Akathisia:  No  Handed:  Right  AIMS (if indicated):     Assets:  Communication Skills Desire for Improvement Intimacy Leisure Time Physical Health Resilience Social Support Talents/Skills  Sleep:       Musculoskeletal: Strength & Muscle Tone: within normal limits Gait & Station: normal Patient leans: N/A  Blood pressure 126/88, pulse 72, temperature 98.3 F (36.8 C), resp. rate 18.  Recommendations:  Based on my evaluation the patient does not appear to have an emergency medical condition.  Fransisca KaufmannAVIS, Kaelee Pfeffer, NP 05/21/2018, 3:05 PM
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
## 2018-05-21 NOTE — Progress Notes (Signed)
The patient admitted to hearing voices while in group this evening. In addition, he admitted to having experienced suicidal thoughts throughout the day as well. His goal for tomorrow is to start taking a new set of medication and that hopefully he will begin to feel better.

## 2018-05-21 NOTE — [facility] Group Notes (Signed)
[facility] Group Notes:  (Nursing/MHT/Case Management/Adjunct)  Date:  05/21/2018  Time:  5:00 PM  Type of Therapy:  Psychoeducational Skills  Participation Level:  Active  Participation Quality:  Inattentive  Affect:  Irritable  Cognitive:  Alert and Oriented  Insight:  Limited  Engagement in Group:  Limited and Resistant  Modes of Intervention:  Activity, Discussion and Education  Summary of Progress/Problems: Pt attended group for a short time and was inattentive most of the group. Pt left before identifying a self care skill to work on post discharge.    Brett AlbinoLandis, Mckenna Boruff E 05/21/2018, 5:00 PM

## 2018-05-21 NOTE — Progress Notes (Signed)
D: Pt was in dayroom upon initial approach.  He presents with anxious, depressed affect and mood.  He reports his day has "been pretty rough."  He reports he has had  "a bunch of suicidal thoughts today, I want to go to sleep and not wake up."  Pt reports plan to cut or hang self.  He verbally contracts for safety.  Denies HI.  Endorses command AH "telling me to kill myself, hang myself."  He reports he has had AH of "voices" for weeks.  He is visible in milieu interacting appropriately.  A: Introduced self to pt.  Actively listened to pt and provided support and encouragement.  PRN medication administered for psychosis.  Q15 minute safety checks maintained.  R: Pt is compliant with medication.  He verbally contracts for safety and reports he will inform staff of needs and concerns.  Will continue to monitor and assess.

## 2018-05-22 DIAGNOSIS — F1011 Alcohol abuse, in remission: Secondary | ICD-10-CM

## 2018-05-22 DIAGNOSIS — Z915 Personal history of self-harm: Secondary | ICD-10-CM

## 2018-05-22 DIAGNOSIS — R51 Headache: Secondary | ICD-10-CM

## 2018-05-22 DIAGNOSIS — H9319 Tinnitus, unspecified ear: Secondary | ICD-10-CM

## 2018-05-22 DIAGNOSIS — Z818 Family history of other mental and behavioral disorders: Secondary | ICD-10-CM

## 2018-05-22 DIAGNOSIS — Z598 Other problems related to housing and economic circumstances: Secondary | ICD-10-CM

## 2018-05-22 DIAGNOSIS — R454 Irritability and anger: Secondary | ICD-10-CM

## 2018-05-22 DIAGNOSIS — R45851 Suicidal ideations: Secondary | ICD-10-CM

## 2018-05-22 DIAGNOSIS — F419 Anxiety disorder, unspecified: Secondary | ICD-10-CM

## 2018-05-22 DIAGNOSIS — F4310 Post-traumatic stress disorder, unspecified: Secondary | ICD-10-CM

## 2018-05-22 DIAGNOSIS — F17210 Nicotine dependence, cigarettes, uncomplicated: Secondary | ICD-10-CM

## 2018-05-22 DIAGNOSIS — Z653 Problems related to other legal circumstances: Secondary | ICD-10-CM

## 2018-05-22 DIAGNOSIS — Z8781 Personal history of (healed) traumatic fracture: Secondary | ICD-10-CM

## 2018-05-22 DIAGNOSIS — Z87820 Personal history of traumatic brain injury: Secondary | ICD-10-CM

## 2018-05-22 DIAGNOSIS — Z91411 Personal history of adult psychological abuse: Secondary | ICD-10-CM

## 2018-05-22 DIAGNOSIS — Z560 Unemployment, unspecified: Secondary | ICD-10-CM

## 2018-05-22 DIAGNOSIS — Z659 Problem related to unspecified psychosocial circumstances: Secondary | ICD-10-CM

## 2018-05-22 DIAGNOSIS — F251 Schizoaffective disorder, depressive type: Secondary | ICD-10-CM

## 2018-05-22 DIAGNOSIS — F515 Nightmare disorder: Secondary | ICD-10-CM

## 2018-05-22 DIAGNOSIS — Z811 Family history of alcohol abuse and dependence: Secondary | ICD-10-CM

## 2018-05-22 MED ORDER — OLANZAPINE 5 MG PO TBDP
5.0000 mg | ORAL_TABLET | Freq: Two times a day (BID) | ORAL | Status: DC | PRN
  Administered 2018-05-22 – 2018-05-26 (×3): 5 mg via ORAL
  Filled 2018-05-22 (×3): qty 1

## 2018-05-22 MED ORDER — OLANZAPINE 5 MG PO TABS
5.0000 mg | ORAL_TABLET | Freq: Every day | ORAL | Status: DC
  Administered 2018-05-22 – 2018-05-24 (×3): 5 mg via ORAL
  Filled 2018-05-22 (×6): qty 1

## 2018-05-22 MED ORDER — GABAPENTIN 100 MG PO CAPS
100.0000 mg | ORAL_CAPSULE | Freq: Three times a day (TID) | ORAL | Status: DC
  Administered 2018-05-22 – 2018-05-26 (×12): 100 mg via ORAL
  Filled 2018-05-22 (×19): qty 1

## 2018-05-22 MED ORDER — ESCITALOPRAM OXALATE 5 MG PO TABS
5.0000 mg | ORAL_TABLET | Freq: Every day | ORAL | Status: DC
  Administered 2018-05-23 – 2018-05-24 (×2): 5 mg via ORAL
  Filled 2018-05-22 (×4): qty 1
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
## 2018-05-22 NOTE — Plan of Care (Signed)
  Problem: Education: Goal: Emotional status will improve Outcome: Not Progressing   Problem: Safety: Goal: Periods of time without injury will increase Outcome: Progressing   Problem: Health Behavior/Discharge Planning: Goal: Compliance with prescribed medication regimen will improve Outcome: Progressing  DAR NOTE: Patient presents with anxious affect and depressed mood.  Denies auditory and visual hallucinations. Reports suicidal thoughts but contracts for safety.  Rates depression at 8, hopelessness at 9, and anxiety at 8.  Maintained on routine safety checks.  Medications given as prescribed.  Support and encouragement offered as needed.  Attended group and participated.  States goal for today is "to get well."  Patient visible in milieu with minimal interaction with peers.  Offered no complaint.

## 2018-05-22 NOTE — [facility] Group Notes (Signed)
LCSW Group Therapy Note 05/22/2018 2:25 PM  Type of Therapy/Topic: Group Therapy: Feelings about Diagnosis  Participation Level: Did Not Attend   Description of Group:  This group will allow patients to explore their thoughts and feelings about diagnoses they have received. Patients will be guided to explore their level of understanding and acceptance of these diagnoses. Facilitator will encourage patients to process their thoughts and feelings about the reactions of others to their diagnosis and will guide patients in identifying ways to discuss their diagnosis with significant others in their lives. This group will be process-oriented, with patients participating in exploration of their own experiences, giving and receiving support, and processing challenge from other group members.  Therapeutic Goals: 1. Patient will demonstrate understanding of diagnosis as evidenced by identifying two or more symptoms of the disorder 2. Patient will be able to express two feelings regarding the diagnosis 3. Patient will demonstrate their ability to communicate their needs through discussion and/or role play  Summary of Patient Progress:  Invited, chose not to attend.     Therapeutic Modalities:  Cognitive Behavioral Therapy Brief Therapy Feelings Identification    Dharma Pare Catalina AntiguaWilliams LCSWA Clinical Social Worker

## 2018-05-22 NOTE — Progress Notes (Addendum)
D: Pt was at nurse's station upon initial approach.  He presents with anxious, depressed affect and anxious, depressed, irritable mood. His goal is to "get well" and he reports he attended groups today.  Pt endorses passive SI without plan or intent and he verbally contracts for safety.  He denies HI, endorses VH, denies AH.  He complains of pain of 6/10 from headache.  He reports feeling "a little agitated."  Pt has been visible in milieu interacting with select peers.    A: Met with pt 1:1 and provided support and encouragement.  Medication administered per order.  PRN medication administered for agitation per request.  PRN medication administered for sleep and anxiety.  Pt was reminded that a urine specimen is still needed and he verbalized understanding.  Q15 minute safety checks maintained.  R: Pt is compliant with medications.  He verbally contracts for safety and reports he will inform staff of needs and concerns.  Will continue to monitor and assess.

## 2018-05-22 NOTE — [facility] Suicide Risk Assessment (Signed)
[facility] INPATIENT:  Family/Significant Other Suicide Prevention Education  Suicide Prevention Education:  Patient Refusal for Family/Significant Other Suicide Prevention Education: The patient Edward Stone has refused to provide written consent for family/significant other to be provided Family/Significant Other Suicide Prevention Education during admission and/or prior to discharge.  Physician notified.  SPE completed with patient, as patient refused to consent to family contact. SPI pamphlet provided to pt and pt was encouraged to share information with support network, ask questions, and talk about any concerns relating to SPE. Patient denies access to guns/firearms and verbalized understanding of information provided. Mobile Crisis information also provided to patient.    Maeola SarahJolan E Daniell Paradise 05/22/2018, 11:05 AM
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
## 2018-05-23 ENCOUNTER — Encounter (HOSPITAL_COMMUNITY): Payer: Self-pay | Admitting: Behavioral Health

## 2018-05-23 LAB — LIPID PANEL
CHOL/HDL RATIO: 2.7 ratio
Cholesterol: 134 mg/dL (ref 0–200)
HDL: 50 mg/dL (ref >40)
LDL CALC: 65 mg/dL (ref 0–99)
Triglycerides: 96 mg/dL (ref <150)
VLDL: 19 mg/dL (ref 0–40)

## 2018-05-23 LAB — HEMOGLOBIN A1C
HEMOGLOBIN A1C: 5.3 % (ref 4.8–5.6)
MEAN PLASMA GLUCOSE: 105.41 mg/dL

## 2018-05-23 MED ORDER — TRAZODONE HCL 100 MG PO TABS
100.0000 mg | ORAL_TABLET | Freq: Every evening | ORAL | Status: DC | PRN
  Administered 2018-05-23 – 2018-05-27 (×5): 100 mg via ORAL
  Filled 2018-05-23 (×3): qty 1
  Filled 2018-05-23: qty 7
  Filled 2018-05-23 (×2): qty 1

## 2018-05-23 NOTE — Progress Notes (Signed)
Pt attend wrap up group. His day was a 3. His day suck. He been ill today all day. He had a bad mood. His medication has been change and he hope it helps.

## 2018-05-23 NOTE — Plan of Care (Signed)
  Problem: Activity: Goal: Sleeping patterns will improve Outcome: Progressing Note:  Slept 6.75 hours last night per flowsheet.    
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
## 2018-05-23 NOTE — Progress Notes (Signed)
Recreation Therapy Notes  Date: 6.12.19 Time: 0930 Location: [address] Dayroom  Group Topic: Stress Management  Goal Area(s) Addresses:  Patient will verbalize importance of using healthy stress management.  Patient will identify positive emotions associated with healthy stress management.   Intervention: Stress Management  Activity :  Meditation.  LRT introduced the stress management technique of meditation.  LRT played Stone meditation on letting go of the past and focusing on right now.  Patiens were to listen and follow along as meditation played.  Education: Stress Management, Discharge Planning.   Education Outcome: Acknowledges edcuation/In group clarification offered/Needs additional education  Clinical Observations/Feedback: Pt did not attend group.     Edward RancherMarjette Jordani Stone, LRT/CTRS          Edward RancherLindsay, Edward Stone 05/23/2018 12:30 PM

## 2018-05-23 NOTE — Tx Team (Signed)
Interdisciplinary Treatment and Diagnostic Plan Update  05/23/2018 Time of Session: [address] [facility] MRN: [id number]  Principal Diagnosis: <principal problem not specified>  Secondary Diagnoses: Active Problems:   MDD (major depressive disorder), recurrent episode, severe (HCC)   Current Medications:  Current Facility-Administered Medications  Medication Dose Route Frequency Provider Last Rate Last Dose  . acetaminophen (TYLENOL) tablet 650 mg  650 mg Oral Q6H PRN Thermon Leyland, NP   650 mg at 05/22/18 1933  . albuterol (PROVENTIL HFA;VENTOLIN HFA) 108 (90 Base) MCG/ACT inhaler 2 puff  2 puff Inhalation Q4H PRN Nira Conn A, NP   2 puff at 05/23/18 0644  . alum & mag hydroxide-simeth (MAALOX/MYLANTA) 200-200-20 MG/5ML suspension 30 mL  30 mL Oral Q4H PRN Fransisca Kaufmann A, NP      . escitalopram (LEXAPRO) tablet 5 mg  5 mg Oral Daily Cobos, Rockey Situ, MD   5 mg at 05/23/18 0749  . gabapentin (NEURONTIN) capsule 100 mg  100 mg Oral TID Cobos, Rockey Situ, MD   100 mg at 05/23/18 0749  . hydrOXYzine (ATARAX/VISTARIL) tablet 25 mg  25 mg Oral Q6H PRN Thermon Leyland, NP   25 mg at 05/22/18 2105  . magnesium hydroxide (MILK OF MAGNESIA) suspension 30 mL  30 mL Oral Daily PRN Fransisca Kaufmann A, NP      . nicotine (NICODERM CQ - dosed in mg/24 hours) patch 21 mg  21 mg Transdermal Daily Antonieta Pert, MD   21 mg at 05/23/18 0752  . OLANZapine (ZYPREXA) tablet 5 mg  5 mg Oral QHS Cobos, Rockey Situ, MD   5 mg at 05/22/18 2105  . OLANZapine zydis (ZYPREXA) disintegrating tablet 5 mg  5 mg Oral Q12H PRN Cobos, Rockey Situ, MD   5 mg at 05/23/18 0752  . traZODone (DESYREL) tablet 50 mg  50 mg Oral QHS PRN Thermon Leyland, NP   50 mg at 05/22/18 2105   PTA Medications: Medications Prior to Admission  Medication Sig Dispense Refill Last Dose  . ARIPiprazole (ABILIFY) 15 MG tablet Take 1 tablet (15 mg total) by mouth daily. 30 tablet 0 unknown  . FLUoxetine (PROZAC) 40 MG capsule Take 1 capsule (40  mg total) by mouth daily. 30 capsule 0 unknown  . gabapentin (NEURONTIN) 100 MG capsule Take 2 capsules (200 mg total) by mouth 3 (three) times daily. 180 capsule 0 unknown  . hydrOXYzine (ATARAX/VISTARIL) 50 MG tablet Take 1 tablet (50 mg total) by mouth every 6 (six) hours as needed for anxiety. 90 tablet 0 unknown  . lisinopril (PRINIVIL,ZESTRIL) 10 MG tablet Take 1 tablet (10 mg total) by mouth daily. 30 tablet 0 unknown  . mirtazapine (REMERON) 7.5 MG tablet Take 1 tablet (7.5 mg total) by mouth at bedtime. 30 tablet 0 unknown  . nicotine (NICODERM CQ - DOSED IN MG/24 HOURS) 21 mg/24hr patch Place 1 patch (21 mg total) onto the skin daily. 28 patch 0 unknown  . prazosin (MINIPRESS) 2 MG capsule Take 1 capsule (2 mg total) by mouth at bedtime. 30 capsule 0 unknown  . traZODone (DESYREL) 50 MG tablet Take 1 tablet (50 mg total) by mouth at bedtime as needed for sleep. 30 tablet 0 unknown  . ibuprofen (ADVIL,MOTRIN) 800 MG tablet Take 1 tablet (800 mg total) by mouth every 8 (eight) hours as needed for moderate pain. (Patient not taking: Reported on 07/12/2017) 21 tablet 0 Not Taking at Unknown time  . mometasone-formoterol (DULERA) 100-5 MCG/ACT AERO Inhale 2  puffs into the lungs 2 (two) times daily. (Patient not taking: Reported on 07/12/2017) 1 Inhaler 0 Not Taking at Unknown time    Patient Stressors: Financial difficulties Medication change or noncompliance  Patient Strengths: Ability for insight Communication skills  Treatment Modalities: Medication Management, Group therapy, Case management,  1 to 1 session with clinician, Psychoeducation, Recreational therapy.   Physician Treatment Plan for Primary Diagnosis: <principal problem not specified> Long Term Goal(s): Improvement in symptoms so as ready for discharge Improvement in symptoms so as ready for discharge   Short Term Goals: Ability to identify changes in lifestyle to reduce recurrence of condition will improve Ability to  maintain clinical measurements within normal limits will improve Ability to verbalize feelings will improve Ability to disclose and discuss suicidal ideas Ability to demonstrate self-control will improve Ability to identify and develop effective coping behaviors will improve  Medication Management: Evaluate patient's response, side effects, and tolerance of medication regimen.  Therapeutic Interventions: 1 to 1 sessions, Unit Group sessions and Medication administration.  Evaluation of Outcomes: Progressing  Physician Treatment Plan for Secondary Diagnosis: Active Problems:   MDD (major depressive disorder), recurrent episode, severe (HCC)  Long Term Goal(s): Improvement in symptoms so as ready for discharge Improvement in symptoms so as ready for discharge   Short Term Goals: Ability to identify changes in lifestyle to reduce recurrence of condition will improve Ability to maintain clinical measurements within normal limits will improve Ability to verbalize feelings will improve Ability to disclose and discuss suicidal ideas Ability to demonstrate self-control will improve Ability to identify and develop effective coping behaviors will improve     Medication Management: Evaluate patient's response, side effects, and tolerance of medication regimen.  Therapeutic Interventions: 1 to 1 sessions, Unit Group sessions and Medication administration.  Evaluation of Outcomes: Progressing   RN Treatment Plan for Primary Diagnosis: <principal problem not specified> Long Term Goal(s): Knowledge of disease and therapeutic regimen to maintain health will improve  Short Term Goals: Ability to identify and develop effective coping behaviors will improve and Compliance with prescribed medications will improve  Medication Management: RN will administer medications as ordered by provider, will assess and evaluate patient's response and provide education to patient for prescribed medication. RN  will report any adverse and/or side effects to prescribing provider.  Therapeutic Interventions: 1 on 1 counseling sessions, Psychoeducation, Medication administration, Evaluate responses to treatment, Monitor vital signs and CBGs as ordered, Perform/monitor CIWA, COWS, AIMS and Fall Risk screenings as ordered, Perform wound care treatments as ordered.  Evaluation of Outcomes: Progressing   LCSW Treatment Plan for Primary Diagnosis: <principal problem not specified> Long Term Goal(s): Safe transition to appropriate next level of care at discharge, Engage patient in therapeutic group addressing interpersonal concerns.  Short Term Goals: Engage patient in aftercare planning with referrals and resources, Increase social support and Increase skills for wellness and recovery  Therapeutic Interventions: Assess for all discharge needs, 1 to 1 time with Social worker, Explore available resources and support systems, Assess for adequacy in community support network, Educate family and significant other(s) on suicide prevention, Complete Psychosocial Assessment, Interpersonal group therapy.  Evaluation of Outcomes: Progressing   Progress in Treatment: Attending groups: No. Participating in groups: No. Taking medication as prescribed: Yes. Toleration medication: Yes. Family/Significant other contact made: No, will contact:  pt refused Patient understands diagnosis: Yes. Discussing patient identified problems/goals with staff: Yes. Medical problems stabilized or resolved: Yes. Denies suicidal/homicidal ideation: Yes. Issues/concerns per patient self-inventory: No. Other: none  New  problem(s) identified: No, Describe:  none  New Short Term/Long Term Goal(s):  Patient Goals:  "get better, get meds working"  Discharge Plan or Barriers:   Reason for Continuation of Hospitalization: Depression Hallucinations Medication stabilization  Estimated Length of Stay: 2-4  days  Attendees: Patient:Edward Stone 05/23/2018   Physician: Dr Jama Flavorsobos, MD 05/23/2018   Nursing: Liborio NixonPatrice White, RN 05/23/2018   RN Care Manager: 05/23/2018   Social Worker: Daleen SquibbGreg Montine Hight, LCSW 05/23/2018   Recreational Therapist:  05/23/2018   Other:  05/23/2018   Other:  05/23/2018   Other: 05/23/2018        Scribe for Treatment Team: Lorri FrederickWierda, Divonte Senger Jon, LCSW 05/23/2018 11:53 AM

## 2018-05-23 NOTE — Progress Notes (Signed)
DAR NOTE: Patient presents with anxious affect and depressed mood.  Pt has been isolating, keeping to himself a lot. Pt complained of anxiety most of the shift, and kept asking for anxiety medication. Pt stated he had a fair sleep, fair appetite, low energy, and poor concentration. Denies pain,  But complained auditory and visual hallucinations.  Rates depression at 4, hopelessness at 3, and anxiety at 4.  Maintained on routine safety checks.  Medications given as prescribed.  Support and encouragement offered as needed.  Attended group and participated.  States goal for today is " get well."  Will continue to monitor.

## 2018-05-23 NOTE — Therapy (Signed)
Occupational Therapy Group Treatment Note  Date:  05/23/2018 Time:  3:08 PM  Group Topic/Focus:  Stress Management  Participation Level:  Minimal  Participation Quality:  Drowsy  Affect:  Depressed and Flat  Cognitive:  Appropriate  Insight: Improving  Engagement in Group:  Lacking and Limited  Modes of Intervention:  Activity, Discussion and Education  Additional Comments:    S: Pt did not make comment or statement throughout group, drowsy and sleeping through most.  O: Stress management group completed to use as productive coping strategy, to help mitigate maladaptive coping to integrate in functional BADL/IADL. Education given on the definition of stress and its cognitive, behavioral, emotional, and physical effects on the body. Stress symptom checklist completed. Stress management tool worksheet discussed to educate on unhealthy vs healthy coping skills to manage stress to improve community integration. Self control circle activity completed to identify areas of control and areas not within personal control. Education given on use of progressive muscle relaxation. PMR script delivered with relaxing music to facilitate relaxation response to help increase ability to engage in BADL.   A: Pt presents to group with flat affect, drowsy and sleeping throughout majority of session. Pt completed stress symptom checklist, showing high signs of stress. Pt engaged in stress management tools worksheet, but not offering any insight and falling asleep. Self control circle activity completed, pt again drowsy and not entirely engaged in activity. Pt engaged in PMR script, mostly leaning back to continue sleeping rather than engaging meaningfully in activity.  P: Pt provided with education on stress management activities to implement into daily routine. Handouts given to facilitate carryover when reintegrating into community     [facility] Medical CenterKaylee Edelin Fryer, [place] YorkMSOT, OTR/L  AvnetKaylee Oluwaseun Bruyere 05/23/2018, 3:08 PM

## 2018-05-23 NOTE — [facility] Group Notes (Signed)
[facility] 05/23/2018 1:15pm  Type of Therapy: Mental Health Association Presentation  Participation Level: Invited. Chose to remain in bed.   Rona RavensHeather S Leveta Wahab, LCSW 05/23/2018 2:06 PM

## 2018-05-24 ENCOUNTER — Encounter (HOSPITAL_COMMUNITY): Payer: Self-pay | Admitting: Behavioral Health

## 2018-05-24 LAB — RAPID URINE DRUG SCREEN, HOSP PERFORMED
AMPHETAMINES: NOT DETECTED
BENZODIAZEPINES: NOT DETECTED
Barbiturates: NOT DETECTED
Cocaine: POSITIVE — AB
OPIATES: NOT DETECTED
TETRAHYDROCANNABINOL: NOT DETECTED

## 2018-05-24 MED ORDER — ESCITALOPRAM OXALATE 10 MG PO TABS
10.0000 mg | ORAL_TABLET | Freq: Every day | ORAL | Status: DC
  Administered 2018-05-25: 10 mg via ORAL
  Filled 2018-05-24 (×3): qty 1

## 2018-05-24 NOTE — [facility] Group Notes (Signed)
[facility] Group Notes:  (Nursing)  Date:  05/24/2018  Time:  400 P Type of Therapy:  Nurse Education  Participation Level:  Active  Participation Quality:  Appropriate  Affect:  Appropriate  Cognitive:  Appropriate  Insight:  Appropriate  Engagement in Group:  Engaged  Modes of Intervention:  Discussion and Education  Summary of Progress/Problems:  Shela NevinValerie S Ameyah Bangura 05/24/2018, 7:46 PM

## 2018-05-24 NOTE — Progress Notes (Signed)
DAR NOTE: Patient presents with anxious affect and depressed mood. Pt stated he feel like if he gets discharged he will just go and kill himself. He stated his depression is not getting better. Reports poor sleep, fair appetite, low energy and poor concentration. Denies pain, but endorse  auditory and visual hallucinations.  Rates depression at 3, hopelessness at 4, and anxiety at 4.  Maintained on routine safety checks.  Medications given as prescribed.  Support and encouragement offered as needed.  Attended group and participated.  States goal for today is "get through today."  Patient observed socializing with peers in the dayroom.  Will continue to monitor.

## 2018-05-24 NOTE — Progress Notes (Signed)
CSW spoke to pt and [facility]  They are [facility] and not sure when they will have an opening but probably not quickly.  CSW spoke with pt and [place].  [facility] and [facility] said this would be acceptable to him.   Garner NashGregory Leronda Stone, MSW, LCSW Clinical Social Worker 05/24/2018 2:36 PM

## 2018-05-24 NOTE — Progress Notes (Signed)
[facility] Group Notes:  (Nursing/MHT/Case Management/Adjunct)  Date:  05/24/2018  Time:  2030  Type of Therapy:  wrap up group  Participation Level:  Active  Participation Quality:  Appropriate, Attentive, Sharing and Supportive  Affect:  Appropriate  Cognitive:  Appropriate  Insight:  Improving  Engagement in Group:  Limited  Modes of Intervention:  Clarification, Education and Support  Summary of Progress/Problems:  Edward Stone, Edward Stone 05/24/2018, 9:10 PM

## 2018-05-24 NOTE — Progress Notes (Signed)
CSW met with pt to discuss plans for housing as pt reported previously that he is homeless.  CSW discussed shelter options including [place] and [facility].  Pt does not want to leave the [place] area and said there is a shelter in [place]: [facility] [place], [phone].  Pt will contact the shelter and find out status of available beds. Winferd Humphrey, MSW, LCSW Clinical Social Worker 05/24/2018 11:54 AM

## 2018-05-24 NOTE — Progress Notes (Signed)
Pt attend wrap up group. His was like a roller coaster up and down. Pt said he finally got his medication correct and he feel better.

## 2018-05-24 NOTE — Progress Notes (Addendum)
[facility] MD Progress Note  05/24/2018 10:16 AM Edward Stone [facility]  MRN:  [id number]   Subjective:  " I am up but I am still about the same. I couldn't sleep because of the voices last night and I am really depressed"  Objective: Face to face evaluation completed, case discussed with treatment team and chart reviewed. Edward Stone is a 32 year old male who  presented to the hospital voluntarily reporting worsening auditory hallucinations, which he states tell him he should die, should commit suicide .   During this evaluation,patient is alert and reined x4, calm and cooperative. He continues to endorse AVH reporting that the voices are telling him to hang himself and now endorse VH describing them as, " seeing shadow." Although he endorses these hallucinations, there are no signs of bizarre behaviors, delusions, paranoia, response to internal stimuli or other psychotic process. He endorse passive SI and ongoing depressive symptoms of hopelessness and lack of motivation. He is unable to provide specific triggers/strssors ealted to these symptoms. He endorse poor sleeping pattern despite increase of Trazodone to 100 mg. Reports his lack of sleep does cause him to feel irritable at times.  Reports appetite as fair. He denies somatic complaints or acute pain. He continues to take medication as prescribed and denies medication related side effects. Although he endorses passive SI without a plan he verbalizes his ability to contract for safety on the unit.         Principal Problem: MDD (major depressive disorder), recurrent episode, severe (HCC) Diagnosis:   Patient Active Problem List   Diagnosis Date Noted  . MDD (major depressive disorder), recurrent episode, severe (HCC) [F33.2] 05/21/2018    Priority: High  . PTSD (post-traumatic stress disorder) [F43.10]   . Schizoaffective disorder (HCC) [F25.9] 07/13/2017  . MDD (major depressive disorder) [F32.9] 07/12/2017  . Severe recurrent major depressive disorder  with psychotic features (HCC) [F33.3] 06/28/2016  . Suicidal behavior [R46.89] 06/23/2016   Total Time spent with patient: 20 minutes  Past Psychiatric History: reports history of depression, prior psychiatric admissions, and past suicidal attempt by cutting wrist ( last year). Reports history of auditory hallucinations which he states have been chronic , starting as an adolescent . States hallucinations are present even when he is not feeling depressed . In the past he has been diagnosed with " Bipolar Disorder", "PTSD".  As per chart notes patient has history of violence, assault charges, but states this was an isolated episode.    Past Medical History:  Past Medical History:  Diagnosis Date  . Anxiety   . Asthma    History reviewed. No pertinent surgical history. Family History: History reviewed. No pertinent family history. Family Psychiatric  History: reports there is a history of Bipolar Disorder and " Shizo depression" in family ( brothers).  Paternal uncle and cousin committed suicide . States an uncle was alcoholic .   Social History:  Social History   Substance and Sexual Activity  Alcohol Use Yes     Social History   Substance and Sexual Activity  Drug Use Yes  . Types: Marijuana    Social History   Socioeconomic History  . Marital status: Single    Spouse name: Not on file  . Number of children: Not on file  . Years of education: Not on file  . Highest education level: Not on file  Occupational History  . Not on file  Social Needs  . Financial resource strain: Not on file  . Food insecurity:  Worry: Not on file    Inability: Not on file  . Transportation needs:    Medical: Not on file    Non-medical: Not on file  Tobacco Use  . Smoking status: Current Every Day Smoker    Packs/day: 1.00    Types: Cigarettes  . Smokeless tobacco: Current User    Types: Chew  Substance and Sexual Activity  . Alcohol use: Yes  . Drug use: Yes    Types: Marijuana   . Sexual activity: Not Currently  Lifestyle  . Physical activity:    Days per week: Not on file    Minutes per session: Not on file  . Stress: Not on file  Relationships  . Social connections:    Talks on phone: Not on file    Gets together: Not on file    Attends religious service: Not on file    Active member of club or organization: Not on file    Attends meetings of clubs or organizations: Not on file    Relationship status: Not on file  Other Topics Concern  . Not on file  Social History Narrative  . Not on file   Additional Social History:    Pain Medications: Please see MAR Prescriptions: Please see MAR Over the Counter: Please see MAR History of alcohol / drug use?: No history of alcohol / drug abuse(Pt denies current SA; states he used to smoke marijuana occasionally but stopped due to being on probation) Longest period of sobriety (when/how long): 8 months    Sleep: Poor  Appetite:  Fair  Current Medications: Current Facility-Administered Medications  Medication Dose Route Frequency Provider Last Rate Last Dose  . acetaminophen (TYLENOL) tablet 650 mg  650 mg Oral Q6H PRN Thermon Leyland, NP   650 mg at 05/23/18 2108  . albuterol (PROVENTIL HFA;VENTOLIN HFA) 108 (90 Base) MCG/ACT inhaler 2 puff  2 puff Inhalation Q4H PRN Nira Conn A, NP   2 puff at 05/24/18 0810  . alum & mag hydroxide-simeth (MAALOX/MYLANTA) 200-200-20 MG/5ML suspension 30 mL  30 mL Oral Q4H PRN Fransisca Kaufmann A, NP      . escitalopram (LEXAPRO) tablet 5 mg  5 mg Oral Daily Nathanuel Cabreja, Rockey Situ, MD   5 mg at 05/24/18 0807  . gabapentin (NEURONTIN) capsule 100 mg  100 mg Oral TID Philopateer Strine, Rockey Situ, MD   100 mg at 05/24/18 0807  . hydrOXYzine (ATARAX/VISTARIL) tablet 25 mg  25 mg Oral Q6H PRN Fransisca Kaufmann A, NP   25 mg at 05/24/18 0809  . magnesium hydroxide (MILK OF MAGNESIA) suspension 30 mL  30 mL Oral Daily PRN Fransisca Kaufmann A, NP      . nicotine (NICODERM CQ - dosed in mg/24 hours) patch 21 mg   21 mg Transdermal Daily Antonieta Pert, MD   21 mg at 05/24/18 0806  . OLANZapine (ZYPREXA) tablet 5 mg  5 mg Oral QHS Linell Meldrum, Rockey Situ, MD   5 mg at 05/23/18 2109  . OLANZapine zydis (ZYPREXA) disintegrating tablet 5 mg  5 mg Oral Q12H PRN Tayllor Breitenstein, Rockey Situ, MD   5 mg at 05/23/18 0752  . traZODone (DESYREL) tablet 100 mg  100 mg Oral QHS PRN Edward Magnuson, NP   100 mg at 05/23/18 2109    Lab Results:  Results for orders placed or performed during the hospital encounter of 05/21/18 (from the past 48 hour(s))  Lipid panel     Status: None   Collection Time: 05/23/18  6:35 AM  Result Value Ref Range   Cholesterol 134 0 - 200 mg/dL   Triglycerides 96 <782<150 mg/dL   HDL 50 >95>40 mg/dL   Total CHOL/HDL Ratio 2.7 RATIO   VLDL 19 0 - 40 mg/dL   LDL Cholesterol 65 0 - 99 mg/dL    Comment:        Total Cholesterol/HDL:CHD Risk Coronary Heart Disease Risk Table                     Men   Women  1/2 Average Risk   3.4   3.3  Average Risk       5.0   4.4  2 X Average Risk   9.6   7.1  3 X Average Risk  23.4   11.0        Use the calculated Patient Ratio above and the CHD Risk Table to determine the patient's CHD Risk.        ATP III CLASSIFICATION (LDL):  <100     mg/dL   Optimal  [id number]  mg/dL   Near or Above                    Optimal  130-159  mg/dL   Borderline  [id number]  mg/dL   High  >962>190     mg/dL   Very High Performed at [facility], [address] [place], [place] [address]   Hemoglobin A1c     Status: None   Collection Time: 05/23/18  6:35 AM  Result Value Ref Range   Hgb A1c MFr Bld 5.3 4.8 - 5.6 %    Comment: (NOTE) Pre diabetes:          5.7%-6.4% Diabetes:              >6.4% Glycemic control for   <7.0% adults with diabetes    Mean Plasma Glucose 105.41 mg/dL    Comment: Performed at [facility], [address] [place], [place] [address]  Urine rapid drug screen (hosp performed)     Status: Abnormal   Collection Time: 05/23/18   8:01 PM  Result Value Ref Range   Opiates NONE DETECTED NONE DETECTED   Cocaine POSITIVE (A) NONE DETECTED   Benzodiazepines NONE DETECTED NONE DETECTED   Amphetamines NONE DETECTED NONE DETECTED   Tetrahydrocannabinol NONE DETECTED NONE DETECTED   Barbiturates NONE DETECTED NONE DETECTED    Comment: (NOTE) DRUG SCREEN FOR MEDICAL PURPOSES ONLY.  IF CONFIRMATION IS NEEDED FOR ANY PURPOSE, NOTIFY LAB WITHIN 5 DAYS. LOWEST DETECTABLE LIMITS FOR URINE DRUG SCREEN Drug Class                     Cutoff (ng/mL) Amphetamine and metabolites    1000 Barbiturate and metabolites    200 Benzodiazepine                 200 Tricyclics and metabolites     300 Opiates and metabolites        300 Cocaine and metabolites        300 THC                            50 Performed at [facility], [address] [place], [place] [address]     Blood Alcohol level:  No results found for: [facility]  Metabolic Disorder Labs: Lab Results  Component Value  Date   HGBA1C 5.3 05/23/2018   MPG 105.41 05/23/2018   MPG 114 07/14/2017   Lab Results  Component Value Date   PROLACTIN 16.6 (H) 06/28/2016   Lab Results  Component Value Date   CHOL 134 05/23/2018   TRIG 96 05/23/2018   HDL 50 05/23/2018   CHOLHDL 2.7 05/23/2018   VLDL 19 05/23/2018   LDLCALC 65 05/23/2018   LDLCALC 68 07/14/2017    Physical Findings: AIMS:  , ,  ,  ,    CIWA:    COWS:     Musculoskeletal: Strength & Muscle Tone: within normal limits Gait & Station: normal Patient leans: N/A  Psychiatric Specialty Exam: Physical Exam  Nursing note and vitals reviewed. Constitutional: He is oriented to person, place, and time.  Neurological: He is alert and oriented to person, place, and time.    Review of Systems  Psychiatric/Behavioral: Positive for depression and hallucinations. Negative for memory loss, substance abuse and suicidal ideas. The patient is nervous/anxious. The patient does not have insomnia.    All other systems reviewed and are negative.   Blood pressure 125/76, pulse 88, temperature 98.1 F (36.7 C), temperature source Oral, resp. rate 18.There is no height or weight on file to calculate BMI.  General Appearance: Fairly Groomed  Eye Contact:  Good  Speech:  Clear and Coherent and Normal Rate  Volume:  Normal  Mood:  Depressed  Affect:  Depressed  Thought Process:  Coherent, Goal Directed, Linear and Descriptions of Associations: Intact  Orientation:  Full (Time, Place, and Person)  Thought Content:  Hallucinations: Auditory Visual  Suicidal Thoughts:  No  Homicidal Thoughts:  No  Memory:  Immediate;   Fair Recent;   Fair  Judgement:  Fair  Insight:  Fair  Psychomotor Activity:  Normal  Concentration:  Concentration: Fair and Attention Span: Fair  Recall:  [employer] of Knowledge:  Fair  Language:  Good  Akathisia:  Negative  Handed:  Right  AIMS (if indicated):     Assets:  Communication Skills Desire for Improvement Resilience Social Support  ADL's:  Intact  Cognition:  WNL  Sleep:  Number of Hours: 6.75     Treatment Plan Summary: Reviewed current treatment plan, Will continue the following plan with adjustments where noted.  Daily contact with patient to assess and evaluate symptoms and progress in treatment   Medication management: Psychiatric conditions arenot improving at this time. To continue reduce current symptoms to base line and improve the patient's overall level of functioning will continueZyprexa 5 mgrs QHS for hallucinations, Increase Lexapro to 10 mgrs QDAY for depression, continue Neurontin 100 mg po TID for anxiety and continue Trazodone to 100 mg po daily at bedtime as needed for insomnia. Patient encouraged to using coping mechanisms to assist with sleep before medication is adjusted again.   Patient will continue to participated in therapeutic milieu including group sessions Patient twill remain on 15 minute observation for safety  checks Will continue to encourage development of coping skills for SI and depression to better improve psychiatric outcomes. Labs: Ordered prolactin. TSH, HgbA1c and  Lipid panel normal. UDS needs collected. Ordered Prolactin.     Edward Magnuson, NP 05/24/2018, 10:16 AM   Patient ID: Edward Stone, male   DOB: [birth year], 32 y.o.   MRN: [id number] .Marland KitchenAgree with NP Progress Note

## 2018-05-24 NOTE — [facility] Group Notes (Signed)
[facility] LCSW Group Therapy Note  Date/Time: 05/24/18, 1315  Type of Therapy/Topic:  Group Therapy:  Balance in Life  Participation Level:  Did not attend  Description of Group:    This group will address the concept of balance and how it feels and looks when one is unbalanced. Patients will be encouraged to process areas in their lives that are out of balance, and identify reasons for remaining unbalanced. Facilitators will guide patients utilizing problem- solving interventions to address and correct the stressor making their life unbalanced. Understanding and applying boundaries will be explored and addressed for obtaining  and maintaining a balanced life. Patients will be encouraged to explore ways to assertively make their unbalanced needs known to significant others in their lives, using other group members and facilitator for support and feedback.  Therapeutic Goals: 1. Patient will identify two or more emotions or situations they have that consume much of in their lives. 2. Patient will identify signs/triggers that life has become out of balance:  3. Patient will identify two ways to set boundaries in order to achieve balance in their lives:  4. Patient will demonstrate ability to communicate their needs through discussion and/or role plays  Summary of Patient Progress:          Therapeutic Modalities:   Cognitive Behavioral Therapy Solution-Focused Therapy Assertiveness Training  Greg Othello Sgroi, LCSW 

## 2018-05-24 NOTE — Plan of Care (Signed)
  Problem: Self-Concept: Goal: Ability to disclose and discuss suicidal ideas will improve Outcome: Progressing Note:  Pt endorses passive SI without a plan.  He verbally contracts for safety.   D: Pt was in the hallway upon initial approach.  Pt presents with depressed affect and mood.  Reports "I've been sleepy all day, irritable, tired."  Pt denies HI.  He reports AH of "seeing things, humans" and VH of "voices wanting me to hurt myself."  Pt does not appear to be responding to internal stimuli.  He is seen interacting appropriately with peers and staff.  Pt attended evening group.    A: Met with pt 1:1.  Actively listened to pt and offered support and encouragement. Medication administered per order.  PRN medication administered for pain and sleep.  Q15 minute safety checks maintained.  R: Pt is safe on the unit.  Pt is compliant with medications.  Pt verbally contracts for safety.  Will continue to monitor and assess.

## 2018-05-25 DIAGNOSIS — F29 Unspecified psychosis not due to a substance or known physiological condition: Secondary | ICD-10-CM

## 2018-05-25 DIAGNOSIS — R450 Nervousness: Secondary | ICD-10-CM

## 2018-05-25 DIAGNOSIS — F333 Major depressive disorder, recurrent, severe with psychotic symptoms: Principal | ICD-10-CM

## 2018-05-25 DIAGNOSIS — F1099 Alcohol use, unspecified with unspecified alcohol-induced disorder: Secondary | ICD-10-CM

## 2018-05-25 DIAGNOSIS — F17220 Nicotine dependence, chewing tobacco, uncomplicated: Secondary | ICD-10-CM

## 2018-05-25 LAB — PROLACTIN: Prolactin: 15.5 ng/mL — ABNORMAL HIGH (ref 4.0–15.2)

## 2018-05-25 MED ORDER — OLANZAPINE 10 MG PO TABS
10.0000 mg | ORAL_TABLET | Freq: Every day | ORAL | Status: DC
  Administered 2018-05-25: 10 mg via ORAL
  Filled 2018-05-25 (×3): qty 1

## 2018-05-25 MED ORDER — ESCITALOPRAM OXALATE 5 MG PO TABS
15.0000 mg | ORAL_TABLET | Freq: Every day | ORAL | Status: DC
  Administered 2018-05-26 – 2018-05-28 (×3): 15 mg via ORAL
  Filled 2018-05-25 (×4): qty 3

## 2018-05-25 NOTE — Progress Notes (Signed)
Recreation Therapy Notes  Date: 6.14.19 Time: 0930 Location: [address] Dayroom  Group Topic: Stress Management  Goal Area(s) Addresses:  Patient will verbalize importance of using healthy stress management.  Patient will identify positive emotions associated with healthy stress management.   Intervention: Stress Management  Activity :  Progressive Muscle Relaxation.  LRT introduced the stress management technique of progressive muscle relaxation.  LRT read a script to guide patients in doing the activity.  Patients were to follow along as script was read to engage in activity.  Education:  Stress Management, Discharge Planning.   Education Outcome: Acknowledges edcuation/In group clarification offered/Needs additional education  Clinical Observations/Feedback: Pt did not attend group.      Caroll RancherMarjette Franceen Erisman, LRT/CTRS         Lillia AbedLindsay, Mishaal Lansdale A 05/25/2018 11:26 AM

## 2018-05-25 NOTE — Progress Notes (Signed)
D Pt has been observed - OOB UAL to the med room today.  then immediately back to sleep - alone- in his room...after he ahs taken his meds_.      A He does takes his scheduled meds as planned. HE does not willingly go to groups and says " I don't go there" when this writer asked him about this. HE says " [facility] [address]".      R Safety is in place and poc cont.

## 2018-05-25 NOTE — Progress Notes (Signed)
Pt reports she is still having a difficult time and having feelings of wanting to kill himself.  He was observed sitting in the dayroom most of evening talking with other patients.  He is not sure when his discharge will take place.  He contracts for safety on the unit.  He denies HI/AVH at this time, although he says he does hear voices telling him to hurt himself.  He makes his needs known to staff.  Support and encouragement offered.  Discharge plans are in process.  Safety maintained with q15 minute checks.

## 2018-05-25 NOTE — [facility] Group Notes (Signed)
LCSW Group Therapy Note 05/25/2018 1:00 PM  Type of Therapy/Topic: Group Therapy: Emotion Regulation  Participation Level: Did Not Attend   Description of Group:  The purpose of this group is to assist patients in learning to regulate negative emotions and experience positive emotions. Patients will be guided to discuss ways in which they have been vulnerable to their negative emotions. These vulnerabilities will be juxtaposed with experiences of positive emotions or situations, and patients will be challenged to use positive emotions to combat negative ones. Special emphasis will be placed on coping with negative emotions in conflict situations, and patients will process healthy conflict resolution skills.  Therapeutic Goals: 1. Patient will identify two positive emotions or experiences to reflect on in order to balance out negative emotions 2. Patient will label two or more emotions that they find the most difficult to experience 3. Patient will demonstrate positive conflict resolution skills through discussion and/or role plays  Summary of Patient Progress:  Invited, chose not to attend.    Therapeutic Modalities:  Cognitive Behavioral Therapy Feelings Identification Dialectical Behavioral Therapy   Edward DroughtJolan Madilynn Stone LCSWA Clinical Social Worker

## 2018-05-25 NOTE — Progress Notes (Signed)
[facility] MD Progress Note  05/25/2018 2:00 PM Edward Stone [facility]  MRN:  [id number]   Subjective: Patient states he continues to feel vaguely depressed although he does endorse some improvement.  The reports partially improved but persistent auditory hallucinations,  Which she states are hypercritical and tell him he should die or kill himself . He denies medication side effects.  Objective: I have discussed case with treatment team and have met with patient.  32 year old male, presented to the hospital reporting depression and auditory hallucinations.  He has had prior admissions for similar presentation. Currently patient is alert, attentive, as above reports persistent depression but acknowledges partial improvement.  Endorses some intermittent passive thoughts of death but denies any actual plan or intention and seems future oriented, expressing interest in disposition plans/options. He endorses residual auditory hallucinations which she describes as chronic.  Currently he does not appear internally preoccupied, no delusions are expressed, no thought disorder is noted. Denies medication side effects. Patient is visible on unit, interactive with other patients, and is exhibiting a fuller range of affect compared to admission. No disruptive or agitated behaviors.   Principal Problem: MDD (major depressive disorder), recurrent episode, severe ([place]) Diagnosis:   Patient Active Problem List   Diagnosis Date Noted  . MDD (major depressive disorder), recurrent episode, severe ([place]) [F33.2] 05/21/2018  . PTSD (post-traumatic stress disorder) [F43.10]   . Schizoaffective disorder ([place]) [F25.9] 07/13/2017  . MDD (major depressive disorder) [F32.9] 07/12/2017  . Severe recurrent major depressive disorder with psychotic features ([place]) [F33.3] 06/28/2016  . Suicidal behavior [R46.89] 06/23/2016   Total Time spent with patient: 20 minutes  Past Psychiatric History:     Past Medical History:  Past  Medical History:  Diagnosis Date  . Anxiety   . Asthma    History reviewed. No pertinent surgical history. Family History: History reviewed. No pertinent family history. Family Psychiatric  History:    Social History:  Social History   Substance and Sexual Activity  Alcohol Use Yes     Social History   Substance and Sexual Activity  Drug Use Yes  . Types: Marijuana    Social History   Socioeconomic History  . Marital status: Single    Spouse name: Not on file  . Number of children: Not on file  . Years of education: Not on file  . Highest education level: Not on file  Occupational History  . Not on file  Social Needs  . Financial resource strain: Not on file  . Food insecurity:    Worry: Not on file    Inability: Not on file  . Transportation needs:    Medical: Not on file    Non-medical: Not on file  Tobacco Use  . Smoking status: Current Every Day Smoker    Packs/day: 1.00    Types: Cigarettes  . Smokeless tobacco: Current User    Types: Chew  Substance and Sexual Activity  . Alcohol use: Yes  . Drug use: Yes    Types: Marijuana  . Sexual activity: Not Currently  Lifestyle  . Physical activity:    Days per week: Not on file    Minutes per session: Not on file  . Stress: Not on file  Relationships  . Social connections:    Talks on phone: Not on file    Gets together: Not on file    Attends religious service: Not on file    Active member of club or organization: Not on file    Attends  meetings of clubs or organizations: Not on file    Relationship status: Not on file  Other Topics Concern  . Not on file  Social History Narrative  . Not on file   Additional Social History:    Pain Medications: Please see MAR Prescriptions: Please see MAR Over the Counter: Please see MAR History of alcohol / drug use?: No history of alcohol / drug abuse(Pt denies current SA; states he used to smoke marijuana occasionally but stopped due to being on  probation) Longest period of sobriety (when/how long): 8 months    Sleep: Fair  Appetite:  Improved  Current Medications: Current Facility-Administered Medications  Medication Dose Route Frequency Provider Last Rate Last Dose  . acetaminophen (TYLENOL) tablet 650 mg  650 mg Oral Q6H PRN Niel Hummer, NP   650 mg at 05/23/18 2108  . albuterol (PROVENTIL HFA;VENTOLIN HFA) 108 (90 Base) MCG/ACT inhaler 2 puff  2 puff Inhalation Q4H PRN Lindon Romp A, NP   2 puff at 05/25/18 0817  . alum & mag hydroxide-simeth (MAALOX/MYLANTA) 200-200-20 MG/5ML suspension 30 mL  30 mL Oral Q4H PRN Elmarie Shiley A, NP      . escitalopram (LEXAPRO) tablet 10 mg  10 mg Oral Daily Mordecai Maes, NP   10 mg at 05/25/18 0816  . gabapentin (NEURONTIN) capsule 100 mg  100 mg Oral TID Keisean Skowron, Myer Peer, MD   100 mg at 05/25/18 1245  . hydrOXYzine (ATARAX/VISTARIL) tablet 25 mg  25 mg Oral Q6H PRN Elmarie Shiley A, NP   25 mg at 05/24/18 0809  . magnesium hydroxide (MILK OF MAGNESIA) suspension 30 mL  30 mL Oral Daily PRN Elmarie Shiley A, NP      . nicotine (NICODERM CQ - dosed in mg/24 hours) patch 21 mg  21 mg Transdermal Daily Sharma Covert, MD   21 mg at 05/25/18 0816  . OLANZapine (ZYPREXA) tablet 5 mg  5 mg Oral QHS Anni Hocevar, Myer Peer, MD   5 mg at 05/24/18 2129  . OLANZapine zydis (ZYPREXA) disintegrating tablet 5 mg  5 mg Oral Q12H PRN Inioluwa Boulay, Myer Peer, MD   5 mg at 05/23/18 0752  . traZODone (DESYREL) tablet 100 mg  100 mg Oral QHS PRN Mordecai Maes, NP   100 mg at 05/24/18 2129    Lab Results:  Results for orders placed or performed during the hospital encounter of 05/21/18 (from the past 48 hour(s))  Urine rapid drug screen (hosp performed)     Status: Abnormal   Collection Time: 05/23/18  8:01 PM  Result Value Ref Range   Opiates NONE DETECTED NONE DETECTED   Cocaine POSITIVE (A) NONE DETECTED   Benzodiazepines NONE DETECTED NONE DETECTED   Amphetamines NONE DETECTED NONE DETECTED    Tetrahydrocannabinol NONE DETECTED NONE DETECTED   Barbiturates NONE DETECTED NONE DETECTED    Comment: (NOTE) DRUG SCREEN FOR MEDICAL PURPOSES ONLY.  IF CONFIRMATION IS NEEDED FOR ANY PURPOSE, NOTIFY LAB WITHIN 5 DAYS. LOWEST DETECTABLE LIMITS FOR URINE DRUG SCREEN Drug Class                     Cutoff (ng/mL) Amphetamine and metabolites    1000 Barbiturate and metabolites    200 Benzodiazepine                 606 Tricyclics and metabolites     300 Opiates and metabolites        300 Cocaine and metabolites  300 THC                            50 Performed at [facility], [place] [address] [place], [place] [address]   Prolactin     Status: Abnormal   Collection Time: 05/24/18  6:25 PM  Result Value Ref Range   Prolactin 15.5 (H) 4.0 - 15.2 ng/mL    Comment: (NOTE) Performed At: [facility] [place], [place] [id number] Rush Farmer MD [id number] Performed at [facility], [place] [address] [place], [place] [address]     Blood Alcohol level:  No results found for: [facility]  Metabolic Disorder Labs: Lab Results  Component Value Date   HGBA1C 5.3 05/23/2018   MPG 105.41 05/23/2018   MPG 114 07/14/2017   Lab Results  Component Value Date   PROLACTIN 15.5 (H) 05/24/2018   PROLACTIN 16.6 (H) 06/28/2016   Lab Results  Component Value Date   CHOL 134 05/23/2018   TRIG 96 05/23/2018   HDL 50 05/23/2018   CHOLHDL 2.7 05/23/2018   VLDL 19 05/23/2018   LDLCALC 65 05/23/2018   LDLCALC 68 07/14/2017    Physical Findings: AIMS:  , ,  ,  ,    CIWA:    COWS:     Musculoskeletal: Strength & Muscle Tone: within normal limits Gait & Station: normal Patient leans: N/A  Psychiatric Specialty Exam: Physical Exam  Nursing note and vitals reviewed. Constitutional: He is oriented to person, place, and time.  Neurological: He is alert and oriented to person, place, and time.    Review of Systems   Psychiatric/Behavioral: Positive for depression and hallucinations. Negative for memory loss, substance abuse and suicidal ideas. The patient is nervous/anxious. The patient does not have insomnia.   All other systems reviewed and are negative. No chest pain, no shortness of breath, no vomiting, no fever  Blood pressure 137/82, pulse (!) 101, temperature 98.8 F (37.1 C), temperature source Oral, resp. rate 16.There is no height or weight on file to calculate BMI.  General Appearance: Fairly Groomed  Eye Contact:  Good  Speech:  Normal Rate  Volume:  Normal  Mood:  Reports she still feels depressed  Affect:  More reactive than on admission  Thought Process:  Linear and Descriptions of Associations: Intact  Orientation:  Full (Time, Place, and Person)  Thought Content:  Reports auditory hallucinations as above, does not appear internally preoccupied, no delusions are expressed  Suicidal Thoughts:  Endorses some passive thoughts of death/dying, contracts for safety on unit and denies any current plan or intention of hurting himself, states he would not feel safe to discharge at this time  Homicidal Thoughts:  No  Memory:  Recent and remote grossly intact  Judgement:  Fair  Insight:  Fair  Psychomotor Activity:  Normal  Concentration:  Concentration: Fair and Attention Span: Fair  Recall:  [employer] of Knowledge:  Fair  Language:  Good  Akathisia:  Negative  Handed:  Right  AIMS (if indicated):     Assets:  Communication Skills Desire for Improvement Resilience Social Support  ADL's:  Intact  Cognition:  WNL  Sleep:  Number of Hours: 5    Assessment -patient reports residual/ongoing depression and auditory hallucinations as well as passive SI (does contract for safety on unit).  Currently does not present internally preoccupied, there is no thought disorder.  Affect presents vaguely restricted but brightens  during interactions with peers.  Currently he is tolerating medications  well ( Lexapro, Zyprexa) .  Treatment Plan Summary:  Treatment plan reviewed as below today June 14 Encourage group and milieu participation to work on Radiographer, therapeutic and symptom reduction Increase Zyprexa to 10 mg nightly for mood disorder and psychosis Increase Lexapro to 15 mg daily for depression and anxiety Increase Neurontin to 200 mg 3 times daily for anxiety Continue trazodone 100 mg nightly PRN for insomnia Treatment team working on disposition options.       Edward Campus, MD 05/25/2018, 2:00 PM   Patient ID: Edward [facility], male   DOB: 1986/01/06, 32 y.o.   MRN: [id number]

## 2018-05-25 NOTE — Progress Notes (Signed)
Pt has been in the bed asleep most of the evening.  He was easily awakened by Clinical research associatewriter to come and take his evening meds.  After he was up, he went into the dayroom and got a snack, sat down to talk with other patients.  He reports he is feeling really sad and depressed tonight.  He is still having passive suicidal thoughts with voices telling him to kill himself, but he can contract for safety with Clinical research associatewriter.  He denies HI.  He says he will be here through the weekend and [facility].  Pt had questions about his meds which Clinical research associatewriter answered.  He makes his needs known to staff.  Support and encouragement offered.  Discharge plans are in process.  Safety maintained with q15 minute checks.

## 2018-05-25 NOTE — Plan of Care (Signed)
  Problem: Education: Goal: Mental status will improve Outcome: Not Progressing   

## 2018-05-25 NOTE — Progress Notes (Signed)
Pt did not attend group. 

## 2018-05-26 DIAGNOSIS — Z590 Homelessness: Secondary | ICD-10-CM

## 2018-05-26 DIAGNOSIS — Z8659 Personal history of other mental and behavioral disorders: Secondary | ICD-10-CM

## 2018-05-26 MED ORDER — GABAPENTIN 100 MG PO CAPS
200.0000 mg | ORAL_CAPSULE | Freq: Three times a day (TID) | ORAL | Status: DC
  Administered 2018-05-26 – 2018-05-28 (×5): 200 mg via ORAL
  Filled 2018-05-26 (×8): qty 2

## 2018-05-26 MED ORDER — OLANZAPINE 7.5 MG PO TABS
15.0000 mg | ORAL_TABLET | Freq: Every day | ORAL | Status: DC
  Administered 2018-05-26 – 2018-05-27 (×2): 15 mg via ORAL
  Filled 2018-05-26 (×5): qty 2

## 2018-05-26 NOTE — Progress Notes (Signed)
The focus of this group is to help patients review their daily goal of treatment and discuss progress on daily workbooks. Pt attended the evening group session and responded to all discussion prompts from the Writer. Pt shared that today was an "okay" day on the unit, the highlight of which was having his medications adjusted. "I've spent most of the day in my room because I don't like being around people. This is the first group I've gone to."  Pt told that upon discharge he will be trying to find a new place to live, which will help him stay well. "That's been causing me a lot of anxiety, but it'll all go away once I'm in a new place. I now have the financial means to make that happen."  Pt reported having no additional needs from Nursing Staff this evening. Pt appeared agitated, though he was polite in interactions.

## 2018-05-26 NOTE — Progress Notes (Signed)
[facility] MD Progress Note  05/26/2018 2:44 PM Atif Chapple [facility]  MRN:  [id number]   Subjective: Patient continues to report depression and states that he feels more depressed today.  He is unsure why, but does acknowledge significant psychosocial stressors, mainly homelessness.  States he has been living with his girlfriend prior to this admission but that she was evicted. Denies medication side effects. Endorses chronic and intermittent passive thoughts of death/dying but at this time denies suicidal plan or intention. Endorses frequent auditory hallucinations which he describes as voices telling him he should die.  Objective: I have reviewed the chart notes and have met with patient.  32 year old male, presented to the hospital reporting depression and auditory hallucinations.  He has had prior admissions for similar presentation.  Has been diagnosed with schizoaffective disorder and PTSD in the past. Patient presents alert, attentive, vaguely irritable, reporting ongoing and chronic depression, auditory hallucinations, persistent passive thoughts of death/dying.  Currently denies plan or intention of hurting himself on unit and contracts for safety on unit. He does not appear internally preoccupied, no delusions are expressed, and does not present with thought disorder.   He is ruminative about stressors, but has presented with future oriented ideations, and has reported to RN he is planning on going to [employer] at discharge. Denies medication side effects-currently on Zyprexa, Lexapro. Limited group/milieu participation.  No disruptive or agitated behaviors on unit.   Principal Problem: MDD (major depressive disorder), recurrent episode, severe ([place]) Diagnosis:   Patient Active Problem List   Diagnosis Date Noted  . MDD (major depressive disorder), recurrent episode, severe ([place]) [F33.2] 05/21/2018  . PTSD (post-traumatic stress disorder) [F43.10]   . Schizoaffective disorder ([place])  [F25.9] 07/13/2017  . MDD (major depressive disorder) [F32.9] 07/12/2017  . Severe recurrent major depressive disorder with psychotic features ([place]) [F33.3] 06/28/2016  . Suicidal behavior [R46.89] 06/23/2016   Total Time spent with patient: 20 minutes  Past Psychiatric History:     Past Medical History:  Past Medical History:  Diagnosis Date  . Anxiety   . Asthma    History reviewed. No pertinent surgical history. Family History: History reviewed. No pertinent family history. Family Psychiatric  History:    Social History:  Social History   Substance and Sexual Activity  Alcohol Use Yes     Social History   Substance and Sexual Activity  Drug Use Yes  . Types: Marijuana    Social History   Socioeconomic History  . Marital status: Single    Spouse name: Not on file  . Number of children: Not on file  . Years of education: Not on file  . Highest education level: Not on file  Occupational History  . Not on file  Social Needs  . Financial resource strain: Not on file  . Food insecurity:    Worry: Not on file    Inability: Not on file  . Transportation needs:    Medical: Not on file    Non-medical: Not on file  Tobacco Use  . Smoking status: Current Every Day Smoker    Packs/day: 1.00    Types: Cigarettes  . Smokeless tobacco: Current User    Types: Chew  Substance and Sexual Activity  . Alcohol use: Yes  . Drug use: Yes    Types: Marijuana  . Sexual activity: Not Currently  Lifestyle  . Physical activity:    Days per week: Not on file    Minutes per session: Not on file  .  Stress: Not on file  Relationships  . Social connections:    Talks on phone: Not on file    Gets together: Not on file    Attends religious service: Not on file    Active member of club or organization: Not on file    Attends meetings of clubs or organizations: Not on file    Relationship status: Not on file  Other Topics Concern  . Not on file  Social History Narrative  .  Not on file   Additional Social History:    Pain Medications: Please see MAR Prescriptions: Please see MAR Over the Counter: Please see MAR History of alcohol / drug use?: No history of alcohol / drug abuse(Pt denies current SA; states he used to smoke marijuana occasionally but stopped due to being on probation) Longest period of sobriety (when/how long): 8 months    Sleep: Fair  Appetite:  Improved  Current Medications: Current Facility-Administered Medications  Medication Dose Route Frequency Provider Last Rate Last Dose  . acetaminophen (TYLENOL) tablet 650 mg  650 mg Oral Q6H PRN Niel Hummer, NP   650 mg at 05/23/18 2108  . albuterol (PROVENTIL HFA;VENTOLIN HFA) 108 (90 Base) MCG/ACT inhaler 2 puff  2 puff Inhalation Q4H PRN Lindon Romp A, NP   2 puff at 05/26/18 1218  . alum & mag hydroxide-simeth (MAALOX/MYLANTA) 200-200-20 MG/5ML suspension 30 mL  30 mL Oral Q4H PRN Elmarie Shiley A, NP      . escitalopram (LEXAPRO) tablet 15 mg  15 mg Oral Daily Chan Rosasco, Myer Peer, MD   15 mg at 05/26/18 0841  . gabapentin (NEURONTIN) capsule 100 mg  100 mg Oral TID Isabeau Mccalla, Myer Peer, MD   100 mg at 05/26/18 1218  . hydrOXYzine (ATARAX/VISTARIL) tablet 25 mg  25 mg Oral Q6H PRN Elmarie Shiley A, NP   25 mg at 05/24/18 0809  . magnesium hydroxide (MILK OF MAGNESIA) suspension 30 mL  30 mL Oral Daily PRN Elmarie Shiley A, NP      . nicotine (NICODERM CQ - dosed in mg/24 hours) patch 21 mg  21 mg Transdermal Daily Sharma Covert, MD   21 mg at 05/26/18 0842  . OLANZapine (ZYPREXA) tablet 10 mg  10 mg Oral QHS Mays Paino, Myer Peer, MD   10 mg at 05/25/18 2124  . OLANZapine zydis (ZYPREXA) disintegrating tablet 5 mg  5 mg Oral Q12H PRN Teegan Guinther, Myer Peer, MD   5 mg at 05/26/18 0841  . traZODone (DESYREL) tablet 100 mg  100 mg Oral QHS PRN Mordecai Maes, NP   100 mg at 05/25/18 2124    Lab Results:  Results for orders placed or performed during the hospital encounter of 05/21/18 (from the past 48  hour(s))  Prolactin     Status: Abnormal   Collection Time: 05/24/18  6:25 PM  Result Value Ref Range   Prolactin 15.5 (H) 4.0 - 15.2 ng/mL    Comment: (NOTE) Performed At: [facility] [place], [place] [id number] Rush Farmer MD [id number] Performed at [facility], [place] [address] [place], [place] [address]     Blood Alcohol level:  No results found for: [facility]  Metabolic Disorder Labs: Lab Results  Component Value Date   HGBA1C 5.3 05/23/2018   MPG 105.41 05/23/2018   MPG 114 07/14/2017   Lab Results  Component Value Date   PROLACTIN 15.5 (H) 05/24/2018   PROLACTIN 16.6 (H) 06/28/2016   Lab Results  Component Value  Date   CHOL 134 05/23/2018   TRIG 96 05/23/2018   HDL 50 05/23/2018   CHOLHDL 2.7 05/23/2018   VLDL 19 05/23/2018   LDLCALC 65 05/23/2018   LDLCALC 68 07/14/2017    Physical Findings: AIMS: Facial and Oral Movements Muscles of Facial Expression: None, normal Lips and Perioral Area: None, normal Jaw: None, normal Tongue: None, normal,Extremity Movements Upper (arms, wrists, hands, fingers): None, normal Lower (legs, knees, ankles, toes): None, normal, Trunk Movements Neck, shoulders, hips: None, normal, Overall Severity Severity of abnormal movements (highest score from questions above): None, normal Incapacitation due to abnormal movements: None, normal Patient's awareness of abnormal movements (rate only patient's report): No Awareness, Dental Status Current problems with teeth and/or dentures?: No Does patient usually wear dentures?: No  CIWA:  CIWA-Ar Total: 2 COWS:  COWS Total Score: 0  Musculoskeletal: Strength & Muscle Tone: within normal limits Gait & Station: normal Patient leans: N/A  Psychiatric Specialty Exam: Physical Exam  Nursing note and vitals reviewed. Constitutional: He is oriented to person, place, and time.  Neurological: He is alert and oriented to person, place, and time.     Review of Systems  Psychiatric/Behavioral: Positive for depression and hallucinations. Negative for memory loss, substance abuse and suicidal ideas. The patient is nervous/anxious. The patient does not have insomnia.   All other systems reviewed and are negative. No chest pain, no shortness of breath, no vomiting, no fever  Blood pressure 127/88, pulse (!) 102, temperature 98.7 F (37.1 C), temperature source Oral, resp. rate 18.There is no height or weight on file to calculate BMI.  General Appearance: Fairly Groomed  Eye Contact:  Good  Speech:  Normal Rate  Volume:  Normal  Mood:  Depressed  Affect:  Constricted  Thought Process:  Linear and Descriptions of Associations: Intact  Orientation:  Full (Time, Place, and Person)  Thought Content:  Reports auditory hallucinations as above, does not appear internally preoccupied, no delusions are expressed  Suicidal Thoughts:  Yes.  without intent/plan currently denies active suicidal plans or intentions, contracts for safety on unit, no homicidal ideations  Homicidal Thoughts:  No  Memory:  Recent and remote grossly intact  Judgement:  Fair  Insight:  Fair  Psychomotor Activity:  Normal  Concentration:  Concentration: Fair and Attention Span: Fair  Recall:  [employer] of Knowledge:  Fair  Language:  Good  Akathisia:  Negative  Handed:  Right  AIMS (if indicated):     Assets:  Communication Skills Desire for Improvement Resilience Social Support  ADL's:  Intact  Cognition:  WNL  Sleep:  Number of Hours: 5    Assessment -patient reports chronic and persistent depression, auditory hallucinations, and passive suicidal ideations.  He contracts for safety on unit. He  is tolerating Zyprexa and Lexapro well thus far.  Of note, he appears to be more future oriented in that  he has discussed potential disposition options with RN and has expressed a hope of going to [employer] at discharge.  Treatment Plan Summary:  Treatment  plan reviewed as below today June 15 Encourage group and milieu participation to work on Radiographer, therapeutic and symptom reduction Increase Zyprexa to 15 mg nightly for mood disorder and psychosis Continue  Lexapro to 15 mg daily for depression and anxiety Increase Neurontin to 200 mg 3 times daily for anxiety Continue Trazodone 100 mg nightly PRN for insomnia Treatment team working on disposition options.       Jenne Campus, MD  05/26/2018, 2:44 PM   Patient ID: Jaeden Messer [facility], male   DOB: 1986/09/06, 32 y.o.   MRN: [id number]

## 2018-05-26 NOTE — Plan of Care (Signed)
Patient has minimal participation in group therapy sessions and plan of care. He is visibly depressed and suicidal. He does contract for safety on the unit and has not displayed any self-injurious behaviors or execution of self-harm. He is cooperative and medication compliant.

## 2018-05-26 NOTE — Progress Notes (Signed)
D: Patient presents depressed, guarded, withdrawn, hopeless. He is having suicidal thoughts with a plan to hang himself at home. He denies intent to carry this out here in the hospital setting. He is hearing command auditory hallucinations telling him to kill himself. He says he is also seeing things, but could not identify what those things were. He states he feels depressed and that "Medicine isn't working" and "I just want to be left alone." Patient is isolating to room. A: Administered Zyprexa Zydis for command AH. Patient checked q15 min, and checks reviewed. Reviewed medication changes with patient and educated on side effects. Educated patient on importance of attending group therapy sessions and educated on several coping skills. Encouarged participation in milieu through recreation therapy and attending meals with peers. Support and encouragement provided. Fluids offered. R: He reports Zyprexa Zydis was only mildly helpful. Patient refused to fill out a self inventory. Patient contracts for safety and is medication compliant. He is not attending group therapy sessions.

## 2018-05-26 NOTE — Progress Notes (Signed)
Nursing Progress Note: 7p-7a D: Pt currently presents with a anxious/nervous/depressed affect and behavior. Pt states "I am not as suicidal as I used to be. I got a $60,000 check in the mail. Waiting on disability has me worried." Interacting appropriately with the milieu. Pt reports good sleep during the previous night with current medication regimen. Pt did attend wrap-up group.  A: Pt provided with medications per providers orders. Pt's labs and vitals were monitored throughout the night. Pt supported emotionally and encouraged to express concerns and questions. Pt educated on medications.  R: Pt's safety ensured with 15 minute and environmental checks. Pt currently denies HI and VH and endorses command AH to hurt self. Pt verbally contracts to seek staff if SI,HI, or AVH occurs and to consult with staff before acting on any harmful thoughts. Will continue to monitor.

## 2018-05-27 DIAGNOSIS — G4700 Insomnia, unspecified: Secondary | ICD-10-CM

## 2018-05-27 NOTE — Plan of Care (Signed)
Pt continues to progress towards goals and d/c. RN will continue to monitor.  

## 2018-05-27 NOTE — [facility] Group Notes (Signed)
Life Skills   Date:  05/27/2018  Time:  5:47 PM  Type of Therapy:  Nurse Education  Identifying Needs :  The group focuses on teaching patients how to identify their needs and then hwo to develop coping skills needed to help them get some of their needs met.  Participation Level:  Did Not Attend  Participation Quality:    Affect:    Cognitive:    Insight:    Engagement in Group:    Modes of Intervention:    Summary of Progress/Problems:  Lauralyn Primes 05/27/2018, 5:47 PM

## 2018-05-27 NOTE — Progress Notes (Signed)
[facility] MD Progress Note  05/27/2018 1:42 PM Edward Stone [facility]  MRN:  [id number]   Subjective: Patient reports she is feeling better today.  He describes improving mood, today denies suicidal ideations.  He also states that auditory hallucinations have improved significantly .  He states he found out he  received a check for significant amount of money which he states " will help me out for a whole year".  Currently presents future oriented and states he no longer plans to go to [facility] as he feels he will be able  to afford a place of his own.  Objective: I have reviewed the chart notes and have met with patient.  32 year old male, presented to the hospital reporting depression and auditory hallucinations.  He has had prior admissions for similar presentation.  Has been diagnosed with schizoaffective disorder and PTSD in the past. Patient describes improving mood and states he feels better today which she attributes to having received a sizable check which will help him be to financially more secure and before the place of his own.  He denies suicidal ideations at present. At this time he is clearly future oriented reporting plan of finding an apartment to live and after discharge.  He reports ongoing but much improved auditory hallucinations, states he is not bothered by them today, and does not present internally preoccupied or with any thought disorder. Denies medication side effects. He is become more visible in milieu although group participation remains limited.  No disruptive or agitated behaviors on unit .   Principal Problem: MDD (major depressive disorder), recurrent episode, severe ([place]) Diagnosis:   Patient Active Problem List   Diagnosis Date Noted  . MDD (major depressive disorder), recurrent episode, severe ([place]) [F33.2] 05/21/2018  . PTSD (post-traumatic stress disorder) [F43.10]   . Schizoaffective disorder ([place]) [F25.9] 07/13/2017  . MDD (major depressive disorder)  [F32.9] 07/12/2017  . Severe recurrent major depressive disorder with psychotic features ([place]) [F33.3] 06/28/2016  . Suicidal behavior [R46.89] 06/23/2016   Total Time spent with patient: 15 minutes  Past Psychiatric History:     Past Medical History:  Past Medical History:  Diagnosis Date  . Anxiety   . Asthma    History reviewed. No pertinent surgical history. Family History: History reviewed. No pertinent family history. Family Psychiatric  History:    Social History:  Social History   Substance and Sexual Activity  Alcohol Use Yes     Social History   Substance and Sexual Activity  Drug Use Yes  . Types: Marijuana    Social History   Socioeconomic History  . Marital status: Single    Spouse name: Not on file  . Number of children: Not on file  . Years of education: Not on file  . Highest education level: Not on file  Occupational History  . Not on file  Social Needs  . Financial resource strain: Not on file  . Food insecurity:    Worry: Not on file    Inability: Not on file  . Transportation needs:    Medical: Not on file    Non-medical: Not on file  Tobacco Use  . Smoking status: Current Every Day Smoker    Packs/day: 1.00    Types: Cigarettes  . Smokeless tobacco: Current User    Types: Chew  Substance and Sexual Activity  . Alcohol use: Yes  . Drug use: Yes    Types: Marijuana  . Sexual activity: Not Currently  Lifestyle  .  Physical activity:    Days per week: Not on file    Minutes per session: Not on file  . Stress: Not on file  Relationships  . Social connections:    Talks on phone: Not on file    Gets together: Not on file    Attends religious service: Not on file    Active member of club or organization: Not on file    Attends meetings of clubs or organizations: Not on file    Relationship status: Not on file  Other Topics Concern  . Not on file  Social History Narrative  . Not on file   Additional Social History:    Pain  Medications: Please see MAR Prescriptions: Please see MAR Over the Counter: Please see MAR History of alcohol / drug use?: No history of alcohol / drug abuse(Pt denies current SA; states he used to smoke marijuana occasionally but stopped due to being on probation) Longest period of sobriety (when/how long): 8 months    Sleep: Improving  Appetite:  Improved  Current Medications: Current Facility-Administered Medications  Medication Dose Route Frequency Provider Last Rate Last Dose  . acetaminophen (TYLENOL) tablet 650 mg  650 mg Oral Q6H PRN Niel Hummer, NP   650 mg at 05/23/18 2108  . albuterol (PROVENTIL HFA;VENTOLIN HFA) 108 (90 Base) MCG/ACT inhaler 2 puff  2 puff Inhalation Q4H PRN Lindon Romp A, NP   2 puff at 05/27/18 0906  . alum & mag hydroxide-simeth (MAALOX/MYLANTA) 200-200-20 MG/5ML suspension 30 mL  30 mL Oral Q4H PRN Elmarie Shiley A, NP      . escitalopram (LEXAPRO) tablet 15 mg  15 mg Oral Daily Cobos, Myer Peer, MD   15 mg at 05/27/18 0906  . gabapentin (NEURONTIN) capsule 200 mg  200 mg Oral TID Cobos, Myer Peer, MD   200 mg at 05/27/18 0907  . hydrOXYzine (ATARAX/VISTARIL) tablet 25 mg  25 mg Oral Q6H PRN Elmarie Shiley A, NP   25 mg at 05/24/18 0809  . magnesium hydroxide (MILK OF MAGNESIA) suspension 30 mL  30 mL Oral Daily PRN Elmarie Shiley A, NP      . nicotine (NICODERM CQ - dosed in mg/24 hours) patch 21 mg  21 mg Transdermal Daily Sharma Covert, MD   21 mg at 05/27/18 0907  . OLANZapine (ZYPREXA) tablet 15 mg  15 mg Oral QHS Cobos, Myer Peer, MD   15 mg at 05/26/18 2104  . OLANZapine zydis (ZYPREXA) disintegrating tablet 5 mg  5 mg Oral Q12H PRN Cobos, Myer Peer, MD   5 mg at 05/26/18 0841  . traZODone (DESYREL) tablet 100 mg  100 mg Oral QHS PRN Mordecai Maes, NP   100 mg at 05/26/18 2104    Lab Results:  No results found for this or any previous visit (from the past 48 hour(s)).  Blood Alcohol level:  No results found for: [facility]  Metabolic  Disorder Labs: Lab Results  Component Value Date   HGBA1C 5.3 05/23/2018   MPG 105.41 05/23/2018   MPG 114 07/14/2017   Lab Results  Component Value Date   PROLACTIN 15.5 (H) 05/24/2018   PROLACTIN 16.6 (H) 06/28/2016   Lab Results  Component Value Date   CHOL 134 05/23/2018   TRIG 96 05/23/2018   HDL 50 05/23/2018   CHOLHDL 2.7 05/23/2018   VLDL 19 05/23/2018   LDLCALC 65 05/23/2018   LDLCALC 68 07/14/2017    Physical Findings: AIMS: Facial and Oral Movements Muscles  of Facial Expression: None, normal Lips and Perioral Area: None, normal Jaw: None, normal Tongue: None, normal,Extremity Movements Upper (arms, wrists, hands, fingers): None, normal Lower (legs, knees, ankles, toes): None, normal, Trunk Movements Neck, shoulders, hips: None, normal, Overall Severity Severity of abnormal movements (highest score from questions above): None, normal Incapacitation due to abnormal movements: None, normal Patient's awareness of abnormal movements (rate only patient's report): No Awareness, Dental Status Current problems with teeth and/or dentures?: No Does patient usually wear dentures?: No  CIWA:  CIWA-Ar Total: 2 COWS:  COWS Total Score: 0  Musculoskeletal: Strength & Muscle Tone: within normal limits Gait & Station: normal Patient leans: N/A  Psychiatric Specialty Exam: Physical Exam  Nursing note and vitals reviewed. Constitutional: He is oriented to person, place, and time.  Neurological: He is alert and oriented to person, place, and time.    Review of Systems  Psychiatric/Behavioral: Positive for depression and hallucinations. Negative for memory loss, substance abuse and suicidal ideas. The patient is nervous/anxious. The patient does not have insomnia.   All other systems reviewed and are negative. No chest pain, no shortness of breath, no vomiting, no fever  Blood pressure 126/75, pulse 94, temperature 98 F (36.7 C), resp. rate 18, height 5' 11"  (1.803 m),  weight 76.7 kg (169 lb).Body mass index is 23.57 kg/m.  General Appearance: Improving grooming  Eye Contact:  Good  Speech:  Normal Rate  Volume:  Normal  Mood:  Acknowledges improving mood  Affect:  Improving range of affect, remains slightly irritable  Thought Process:  Linear and Descriptions of Associations: Intact  Orientation:  Full (Time, Place, and Person)  Thought Content:  Describes improvement of auditory hallucinations, does not appear to be internally preoccupied, no delusions  Suicidal Thoughts:  No currently denies active suicidal plans or intentions, contracts for safety on unit, no homicidal ideations  Homicidal Thoughts:  No  Memory:  Recent and remote grossly intact  Judgement:  Fair- improving   Insight:  Fair  Psychomotor Activity:  Normal  Concentration:  Concentration: Fair and Attention Span: Fair  Recall:  [employer] of Knowledge:  Fair  Language:  Good  Akathisia:  Negative  Handed:  Right  AIMS (if indicated):     Assets:  Communication Skills Desire for Improvement Resilience Social Support  ADL's:  Intact  Cognition:  WNL  Sleep:  Number of Hours: 5    Assessment -patient reports he is feeling better today, and denies suicidal ideations at this time, presenting future oriented.  He also reports improvement ,but not complete resolution, of auditory hallucinations.  He states he received a sizable check which will allow him to live independently and find a place of his own which is his stated goal.  Tolerating medications well, denies any side effects or increased sedation from recent dose increases.  Treatment Plan Summary:  Treatment plan reviewed as below today June 16 Encourage group and milieu participation to work on Radiographer, therapeutic and symptom reduction Continue  Zyprexa 15 mg nightly for mood disorder and psychosis Continue  Lexapro  15 mg daily for depression and anxiety Continue Neurontin  200 mg 3 times daily for anxiety Continue Trazodone  100 mg nightly PRN for insomnia Treatment team working on disposition options.       Jenne Campus, MD 05/27/2018, 1:42 PM   Patient ID: Edward Stone, male   DOB: 1986/09/10, 32 y.o.   MRN: [id number] Patient ID: Edward Stone [facility], male   DOB: 09/22/1986,  32 y.o.   MRN: [id number]

## 2018-05-28 MED ORDER — MIRTAZAPINE 7.5 MG PO TABS: 7.5000 mg | ORAL_TABLET | Freq: Every day | ORAL | 0 refills | Status: DC

## 2018-05-28 MED ORDER — MOMETASONE FURO-FORMOTEROL FUM 100-5 MCG/ACT IN AERO: 2.0000 | INHALATION_SPRAY | Freq: Two times a day (BID) | RESPIRATORY_TRACT | 0 refills | Status: DC

## 2018-05-28 MED ORDER — LISINOPRIL 10 MG PO TABS: 10.0000 mg | ORAL_TABLET | Freq: Every day | ORAL | 0 refills | Status: DC

## 2018-05-28 MED ORDER — NICOTINE 21 MG/24HR TD PT24: 21.0000 mg | MEDICATED_PATCH | Freq: Every day | TRANSDERMAL | 0 refills | Status: AC

## 2018-05-28 MED ORDER — TRAZODONE HCL 100 MG PO TABS: 100.0000 mg | ORAL_TABLET | Freq: Every evening | ORAL | 0 refills | Status: AC | PRN

## 2018-05-28 MED ORDER — ALBUTEROL SULFATE HFA 108 (90 BASE) MCG/ACT IN AERS: 2.0000 | INHALATION_SPRAY | RESPIRATORY_TRACT | Status: AC | PRN

## 2018-05-28 MED ORDER — ESCITALOPRAM OXALATE 5 MG PO TABS: 15.0000 mg | ORAL_TABLET | Freq: Every day | ORAL | 0 refills | Status: AC

## 2018-05-28 MED ORDER — PRAZOSIN HCL 2 MG PO CAPS: 2.0000 mg | ORAL_CAPSULE | Freq: Every day | ORAL | 0 refills | Status: DC

## 2018-05-28 MED ORDER — HYDROXYZINE HCL 25 MG PO TABS: 25.0000 mg | ORAL_TABLET | Freq: Four times a day (QID) | ORAL | 0 refills | Status: AC | PRN

## 2018-05-28 MED ORDER — GABAPENTIN 100 MG PO CAPS: 200.0000 mg | ORAL_CAPSULE | Freq: Three times a day (TID) | ORAL | 0 refills | Status: AC

## 2018-05-28 MED ORDER — MOMETASONE FURO-FORMOTEROL FUM 100-5 MCG/ACT IN AERO: 2.0000 | INHALATION_SPRAY | Freq: Two times a day (BID) | RESPIRATORY_TRACT | 0 refills | Status: AC

## 2018-05-28 MED ORDER — OLANZAPINE 15 MG PO TABS: 15.0000 mg | ORAL_TABLET | Freq: Every day | ORAL | 0 refills | Status: AC

## 2018-05-28 NOTE — [facility] Suicide Risk Assessment (Signed)
[facility] Discharge Suicide Risk Assessment   Principal Problem: MDD (major depressive disorder), recurrent episode, severe (H[facility]) Discharge Diagnoses:  Patient Active Problem List   Diagnosis Date Noted  . MDD (major depressive disorder), recurrent episode, severe (HCC) [F33.2] 05/21/2018  . PTSD (post-traumatic stress disorder) [F43.10]   . Schizoaffective disorder ([facility]) [F25.9] 07/13/2017  . MDD (major depressive disorder) [F32.9] 07/12/2017  . 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 Suicidal behavior [R46.89] 06/23/2016    Total Time spent with patient: 30 minutes  Musculoskeletal: Strength & Muscle Tone: within normal limits Gait & Station: normal Patient leans: N/A  Psychiatric Specialty Exam: ROS denies headache, no chest pain, no shortness of breath, no vomiting, no fever or chills  Blood pressure 129/86, pulse 96, temperature 98.9 F (37.2 C), temperature source Oral, resp. rate 16, height 5\' 11"  (1.803 m), weight 76.7 kg (169 lb).Body mass index is 23.57 kg/m.  General Appearance: Improved grooming  Eye Contact::  Good  Speech:  Normal Rate409  Volume:  Normal  Mood:  Reports his mood is improved feels less depressed,   Affect:  Vaguely restricted/irritable  Thought Process:  Linear and Descriptions of Associations: Intact  Orientation:  Full (Time, Place, and Person)  Thought Content:  Reports that auditory hallucinations have improved significantly and today has not experienced any, does not appear internally preoccupied, no delusions are expressed  Suicidal Thoughts:  No-denies any suicidal ideations, denies any self-injurious ideations, denies any homicidal or violent ideations  Homicidal Thoughts:  No  Memory:  Recent and remote grossly intact  Judgement:  Other:  Improving  Insight:  Improving  Psychomotor Activity:  Normal  Concentration:  Good  Recall:  Good  Fund of Knowledge:Good  Language: Good   Akathisia:  Negative  Handed:  Right  AIMS (if indicated):     Assets:  Communication Skills Resilience  Sleep:  Number of Hours: 6.75  Cognition: WNL  ADL's:  Intact   Mental Status Per Nursing Assessment::   On Admission:  Suicidal ideation indicated by patient  Demographic Factors:  32 year old male, lives with girlfriend, has a 22 year old son who lives with the mother, currently unemployed  Loss Factors: unemployment   Historical Factors: History of prior psychiatric admissions, history of suicidal ideations and cutting wrist, reports history of PTSD, 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  Affect remains vaguely restricted but has also improved partially.  No thought disorder, denies suicidal ideations, denies homicidal ideations, currently presents future oriented.  Reports auditory hallucinations have improved  significantly and is currently not experiencing them, does not appear internally preoccupied, no delusions are expressed. He reports he recently received a check which will allow him to live independently/secure housing.  This has helped him feel better Denies medication side effects No disruptive behaviors on unit  Cognitive Features That Contribute To Risk:  Closed-mindedness and Loss of executive function    Suicide Risk:  Mild:  Suicidal ideation of limited frequency, intensity, duration, and specificity.  There are no identifiable plans, no associated intent, mild dysphoria and related symptoms, good self-control (both objective and subjective assessment), few other risk factors, and identifiable protective factors, including available and accessible social support.  Follow-up  Information  [facility] Go on 05/29/2018.   Why:  Please attend your appt on Tuesday, 05/29/18, at 8:15am. Contact information: [address] [place] [address] [phone]           Plan Of Care/Follow-up recommendations:  Activity:  As tolerated Diet:  Regular Tests:  NA Other:  See below  Patient is expressing readiness for discharge, plans to return to live with his girlfriend. There are no current grounds for involuntary commitment. [facility].   Craige CottaFernando A Shanelle Clontz, MD 05/28/2018, 12:18 PM

## 2018-05-28 NOTE — Progress Notes (Signed)
Patient ID: Edward Stone, male   DOB: Jan 09, 1986, 32 y.o.   MRN: [id number]   Patient denies SI, HI and AVH.  Patient stated "I am ready to go home.  I am going to continue my treatment outside of the hospital."  Patient acknowledged understanding of all discharge instructions and receipt of all personal belongings.

## 2018-05-28 NOTE — Progress Notes (Signed)
CSW met with pt to again discuss discharge plans.  Pt reports he received a legal settlement check over the weekend and now has enough money to rent a place in [place].  His girlfriend is picking him up from the hospital and they will get this done today.  Would like to have appt set up at [facility]. Winferd Humphrey, MSW, LCSW Clinical Social Worker 05/28/2018 10:09 AM

## 2018-05-28 NOTE — Progress Notes (Signed)
Recreation Therapy Notes  Date: 6.17.19 Time: 0930 Location: [address] Dayroom  Group Topic: Stress Management  Goal Area(s) Addresses:  Patient will verbalize importance of using healthy stress management.  Patient will identify positive emotions associated with healthy stress management.   Intervention: Stress Management  Activity :  Choice Meditation.  LRT played a meditation on choice.  Patients were to follow along as meditation was played to engage in activity.  Education:  Stress Management, Discharge Planning.   Education Outcome: Acknowledges edcuation/In group clarification offered/Needs additional education  Clinical Observations/Feedback: Pt did not attend group.    Caroll RancherMarjette Zakyria Metzinger, LRT/CTRS         Lillia AbedLindsay, Claris Guymon A 05/28/2018 11:50 AM

## 2018-05-28 NOTE — Tx Team (Signed)
Interdisciplinary Treatment and Diagnostic Plan Update  05/28/2018 Time of Session: [address] [facility] MRN: [id number]  Principal Diagnosis: MDD (major depressive disorder), recurrent episode, severe (HCC)  Secondary Diagnoses: Principal Problem:   MDD (major depressive disorder), recurrent episode, severe (HCC)   Current Medications:  Current Facility-Administered Medications  Medication Dose Route Frequency Provider Last Rate Last Dose  . acetaminophen (TYLENOL) tablet 650 mg  650 mg Oral Q6H PRN Fransisca Kaufmann A, NP   650 mg at 05/27/18 2110  . albuterol (PROVENTIL HFA;VENTOLIN HFA) 108 (90 Base) MCG/ACT inhaler 2 puff  2 puff Inhalation Q4H PRN Nira Conn A, NP   2 puff at 05/27/18 0906  . alum & mag hydroxide-simeth (MAALOX/MYLANTA) 200-200-20 MG/5ML suspension 30 mL  30 mL Oral Q4H PRN Fransisca Kaufmann A, NP      . escitalopram (LEXAPRO) tablet 15 mg  15 mg Oral Daily Cobos, Rockey Situ, MD   15 mg at 05/28/18 0830  . gabapentin (NEURONTIN) capsule 200 mg  200 mg Oral TID Cobos, Rockey Situ, MD   200 mg at 05/28/18 1208  . hydrOXYzine (ATARAX/VISTARIL) tablet 25 mg  25 mg Oral Q6H PRN Thermon Leyland, NP   25 mg at 05/27/18 2110  . magnesium hydroxide (MILK OF MAGNESIA) suspension 30 mL  30 mL Oral Daily PRN Fransisca Kaufmann A, NP      . nicotine (NICODERM CQ - dosed in mg/24 hours) patch 21 mg  21 mg Transdermal Daily Antonieta Pert, MD   21 mg at 05/28/18 7829  . OLANZapine (ZYPREXA) tablet 15 mg  15 mg Oral QHS Cobos, Rockey Situ, MD   15 mg at 05/27/18 2110  . OLANZapine zydis (ZYPREXA) disintegrating tablet 5 mg  5 mg Oral Q12H PRN Cobos, Rockey Situ, MD   5 mg at 05/26/18 0841  . traZODone (DESYREL) tablet 100 mg  100 mg Oral QHS PRN Denzil Magnuson, NP   100 mg at 05/27/18 2110   Current Outpatient Medications  Medication Sig Dispense Refill  . albuterol (PROVENTIL HFA;VENTOLIN HFA) 108 (90 Base) MCG/ACT inhaler Inhale 2 puffs into the lungs every 4 (four) hours as needed for  wheezing or shortness of breath.    Melene Muller ON 05/29/2018] escitalopram (LEXAPRO) 5 MG tablet Take 3 tablets (15 mg total) by mouth daily. For depression 90 tablet 0  . gabapentin (NEURONTIN) 100 MG capsule Take 2 capsules (200 mg total) by mouth 3 (three) times daily. For agitation 180 capsule 0  . hydrOXYzine (ATARAX/VISTARIL) 25 MG tablet Take 1 tablet (25 mg total) by mouth every 6 (six) hours as needed for anxiety. 60 tablet 0  . mometasone-formoterol (DULERA) 100-5 MCG/ACT AERO Inhale 2 puffs into the lungs 2 (two) times daily. For shortness of breath 1 Inhaler 0  . nicotine (NICODERM CQ - DOSED IN MG/24 HOURS) 21 mg/24hr patch Place 1 patch (21 mg total) onto the skin daily. (May purchase from over the counter): For smoking cessation 28 patch 0  . OLANZapine (ZYPREXA) 15 MG tablet Take 1 tablet (15 mg total) by mouth at bedtime. For mood control 30 tablet 0  . traZODone (DESYREL) 100 MG tablet Take 1 tablet (100 mg total) by mouth at bedtime as needed for sleep. 30 tablet 0   PTA Medications: No medications prior to admission.    Patient Stressors: Financial difficulties Medication change or noncompliance  Patient Strengths: Ability for insight Communication skills  Treatment Modalities: Medication Management, Group therapy, Case management,  1 to 1 session  with clinician, Psychoeducation, Recreational therapy.   Physician Treatment Plan for Primary Diagnosis: MDD (major depressive disorder), recurrent episode, severe (HCC) Long Term Goal(s): Improvement in symptoms so as ready for discharge Improvement in symptoms so as ready for discharge   Short Term Goals: Ability to identify changes in lifestyle to reduce recurrence of condition will improve Ability to maintain clinical measurements within normal limits will improve Ability to verbalize feelings will improve Ability to disclose and discuss suicidal ideas Ability to demonstrate self-control will improve Ability to identify  and develop effective coping behaviors will improve  Medication Management: Evaluate patient's response, side effects, and tolerance of medication regimen.  Therapeutic Interventions: 1 to 1 sessions, Unit Group sessions and Medication administration.  Evaluation of Outcomes: Adequate for Discharge  Physician Treatment Plan for Secondary Diagnosis: Principal Problem:   MDD (major depressive disorder), recurrent episode, severe (HCC)  Long Term Goal(s): Improvement in symptoms so as ready for discharge Improvement in symptoms so as ready for discharge   Short Term Goals: Ability to identify changes in lifestyle to reduce recurrence of condition will improve Ability to maintain clinical measurements within normal limits will improve Ability to verbalize feelings will improve Ability to disclose and discuss suicidal ideas Ability to demonstrate self-control will improve Ability to identify and develop effective coping behaviors will improve     Medication Management: Evaluate patient's response, side effects, and tolerance of medication regimen.  Therapeutic Interventions: 1 to 1 sessions, Unit Group sessions and Medication administration.  Evaluation of Outcomes: Adequate for Discharge   RN Treatment Plan for Primary Diagnosis: MDD (major depressive disorder), recurrent episode, severe (HCC) Long Term Goal(s): Knowledge of disease and therapeutic regimen to maintain health will improve  Short Term Goals: Ability to identify and develop effective coping behaviors will improve and Compliance with prescribed medications will improve  Medication Management: RN will administer medications as ordered by provider, will assess and evaluate patient's response and provide education to patient for prescribed medication. RN will report any adverse and/or side effects to prescribing provider.  Therapeutic Interventions: 1 on 1 counseling sessions, Psychoeducation, Medication administration,  Evaluate responses to treatment, Monitor vital signs and CBGs as ordered, Perform/monitor CIWA, COWS, AIMS and Fall Risk screenings as ordered, Perform wound care treatments as ordered.  Evaluation of Outcomes: Adequate for Discharge   LCSW Treatment Plan for Primary Diagnosis: MDD (major depressive disorder), recurrent episode, severe (HCC) Long Term Goal(s): Safe transition to appropriate next level of care at discharge, Engage patient in therapeutic group addressing interpersonal concerns.  Short Term Goals: Engage patient in aftercare planning with referrals and resources, Increase social support and Increase skills for wellness and recovery  Therapeutic Interventions: Assess for all discharge needs, 1 to 1 time with Social worker, Explore available resources and support systems, Assess for adequacy in community support network, Educate family and significant other(s) on suicide prevention, Complete Psychosocial Assessment, Interpersonal group therapy.  Evaluation of Outcomes: Adequate for Discharge   Progress in Treatment: Attending groups: No. Participating in groups: No. Taking medication as prescribed: Yes. Toleration medication: Yes. Family/Significant other contact made: No, will contact:  pt refused Patient understands diagnosis: Yes. Discussing patient identified problems/goals with staff: Yes. Medical problems stabilized or resolved: Yes. Denies suicidal/homicidal ideation: Yes. Issues/concerns per patient self-inventory: No. Other: none  New problem(s) identified: No, Describe:  none  New Short Term/Long Term Goal(s):  Patient Goals:  "get better, get meds working"  Discharge Plan or Barriers: Patient discharged home and [facility]  [place] for medication management and therapy services.   Reason for Continuation of Hospitalization: None   Estimated Length of Stay: Discharged, Monday, 05/28/18  Attendees: Patient:Edward Stone 05/23/2018   Physician:  Dr Jama Flavors, MD 05/23/2018   Nursing: Liborio Nixon, RN 05/23/2018   RN Care Manager: 05/23/2018   Social Worker: Daleen Squibb, LCSW;' Baldo Daub, LCSWA 05/23/2018   Recreational Therapist: X 05/23/2018   Other: X 05/23/2018   Other: X 05/23/2018   Other:X 05/23/2018        Scribe for Treatment Team: Maeola Sarah, LCSWA 05/28/2018 1:38 PM

## 2018-05-28 NOTE — Progress Notes (Addendum)
Nursing note 7p-7a  Pt observed interacting with peers post medication administration this shift. Displayed a flat affect and anxious mood upon interaction with this Clinical research associatewriter. Pt complained of headache pain, anxiety and insomnia PRN medication given per MD order, see MAR. Pt denies SI/HI, but endorsed both audio and visual hallucinations at this time. A/V Hallucinations are command in type and tells him to hurt himself and others. Pt verbally agrees to come to staff with feelings of harm to self or others, Pt able to contract for safety. Pt now resting in bed with eyes closed with no signs or symptoms of pain or distress noted. Pt continues to remain safe on the unit and is observed by rounding every 15 min. RN will continue to monitor.

## 2018-05-28 NOTE — Progress Notes (Signed)
  [facility] Discharge [facility] :  Will you be returning to the same living situation after discharge:  No. Pt plans to rent his own apartment. At discharge, do you have transportation home?: Yes,  girlfriend Do you have the ability to pay for your medications: No. Will [facility] with [facility].  Release of information consent forms completed and in the chart;  Patient's signature needed at discharge.  Patient to Follow up at: Follow-up Information    [facility]. Go on 05/29/2018.   Why:  Please attend your appt on Tuesday, 05/29/18, at 8:15am. Contact information: [address] [place] [address] [phone]           Next level of care provider has [facility] Link:no  Safety Planning and Suicide Prevention discussed: No.Pt refused consent.  Have you used any form of tobacco in the last 30 days? (Cigarettes, Smokeless Tobacco, Cigars, and/or Pipes): Yes  Has patient been referred to the Quitline?: Patient refused referral  Patient has been referred for addiction treatment: Yes  Lorri FrederickWierda, Tambra Muller Jon, LCSW 05/28/2018, 10:11 AM

## 2018-05-28 NOTE — Discharge Summary (Addendum)
Physician Discharge Summary Note  Patient:  Edward Stone is an 32 y.o., male MRN:  [id number] DOB:  1986/01/09 Patient phone:  [phone] (home)  Patient address:   [address] [place] [address],  Total Time spent with patient: Greater than 30 minutes  Date of Admission:  05/21/2018  Date of Discharge: 05/28/18  Reason for Admission: Worsening auditory hallucinations.    Principal Problem: MDD (major depressive disorder), recurrent episode, severe [facility])  Discharge Diagnoses: Patient Active Problem List   Diagnosis Date Noted  . MDD (major depressive disorder), recurrent episode, severe (HCC) [F33.2] 05/21/2018  . PTSD (post-traumatic stress disorder) [F43.10]   . Schizoaffective disorder (HCC) [F25.9] 07/13/2017  . MDD (major depressive disorder) [F32.9] 07/12/2017  . 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 Suicidal behavior [R46.89] 06/23/2016   Past Psychiatric History: See H&P  Past Medical History:  Past Medical History:  Diagnosis Date  . Anxiety   . Asthma    History reviewed. No pertinent surgical history.  Family History: History reviewed. No pertinent family history.  Family Psychiatric  History: See H&P  Social History:  Social History   Substance and Sexual Activity  Alcohol Use Yes     Social History   Substance and Sexual Activity  Drug Use Yes  . Types: Marijuana    Social History   Socioeconomic History  . Marital status: Single    Spouse name: Not on file  . Number of children: Not on file  . Years of education: Not on file  . Highest education level: Not on file  Occupational History  . Not on file  Social Needs  . Financial resource strain: Not on file  . Food insecurity:    Worry: Not on file    Inability: Not on file  . Transportation needs:    Medical: Not on file    Non-medical: Not on file  Tobacco Use  . Smoking status: Current Every Day Smoker     Packs/day: 1.00    Types: Cigarettes  . Smokeless tobacco: Current User    Types: Chew  Substance and Sexual Activity  . Alcohol use: Yes  . Drug use: Yes    Types: Marijuana  . Sexual activity: Not Currently  Lifestyle  . Physical activity:    Days per week: Not on file    Minutes per session: Not on file  . Stress: Not on file  Relationships  . Social connections:    Talks on phone: Not on file    Gets together: Not on file    Attends religious service: Not on file    Active member of club or organization: Not on file    Attends meetings of clubs or organizations: Not on file    Relationship status: Not on file  Other Topics Concern  . Not on file  Social History Narrative  . Not on file   Hospital Course: (Per Md's admission notes):  32 year old male, presented to the hospital voluntarily reporting worsening auditory hallucinations, which he states tell him he should die , should commit suicide . Reports he has had thoughts of hanging self . He reports worsening depression, and describes neuro-vegetative symptoms of depression as below. Although reports chronic depression, states it has worsened further recently in the context of psychosocial/financial stressors, mainly facing possible eviction. Denies drug or alcohol abuse . Reports he has been compliant with his prescribed medications (Prozac, Abilify) but states, " I  don't think they are working anymore".   After the above admission assessment, Bridget Hartshorn was started on the medication regimen for his presenting symptoms. And with his consent, he received & was discharged on; Lexapro 15 mg for depression, Gabapentin 200 mg for agitation, Vistaril 25 mg prn for anxiety, Nicotine patch 21 mg for smoking cessation, Olanzapine 15 mg for mood control & Trazodone 100 mg for insomnia. He was also enrolled & participated in the group counseling sessions being offered & held on this unit. He learned coping skills. Bridget Hartshorn was resumed & discharged  on all his pertinent home medications for all his pre-existing medical issues presented. He tolerated his treatment regimen without any adverse effects or reactions reported.  Bridget Hartshorn was seen today by the attending psychiatrist. He is pleased that he sought help for his symptoms. Says he is tolerating his medications well. No withdrawal symptoms. No craving for drugs. He is no longer feeling depressed. Describes normal energy and ability to think. Able to focus on task. No suicidal thoughts. No homicidal thoughts. No thoughts of violence. Patient reports normal biological functions.    The nursing staff reports that patient has been appropriate on the unit. Patient has been interacting well with peers & staff. No behavioral issues. Patient has not voiced any suicidal thoughts. Patient has not been observed to be internally stimulated or pre-occupied. Patient has been adherent with treatment recommendations. Patient has been tolerating their medication well, denies any adverse reactions or side effects.   Bridget Hartshorn is currently being discharged to continue mental health care on an outpatient basis as noted below. He is provided with all the necessary information needed to make this appointment without problems. He received from the [facility] pharmacy, a 7 days worth supply samples of his [facility] discharge medications. He left [facility] with all personal belongings in no apparent distress. Transportation per girlfriend.  Physical Findings: AIMS: Facial and Oral Movements Muscles of Facial Expression: None, normal Lips and Perioral Area: None, normal Jaw: None, normal Tongue: None, normal,Extremity Movements Upper (arms, wrists, hands, fingers): None, normal Lower (legs, knees, ankles, toes): None, normal, Trunk Movements Neck, shoulders, hips: None, normal, Overall Severity Severity of abnormal movements (highest score from questions above): None, normal Incapacitation due to abnormal movements: None, normal Patient's  awareness of abnormal movements (rate only patient's report): No Awareness, Dental Status Current problems with teeth and/or dentures?: No Does patient usually wear dentures?: No  CIWA:  CIWA-Ar Total: 2 COWS:  COWS Total Score: 0  Musculoskeletal: Strength & Muscle Tone: within normal limits Gait & Station: normal Patient leans: N/A  Psychiatric Specialty Exam: Physical Exam  Constitutional: He appears well-developed.  HENT:  Head: Normocephalic.  Eyes: Pupils are equal, round, and reactive to light.  Neck: Normal range of motion.  Cardiovascular: Normal rate.  Respiratory: Effort normal.  GI: Soft.  Genitourinary:  Genitourinary Comments: Deferred  Musculoskeletal: Normal range of motion.  Neurological: He is alert.  Skin: Skin is warm.    Review of Systems  Constitutional: Negative.   HENT: Negative.   Eyes: Negative.   Respiratory: Negative.   Cardiovascular: Negative.   Gastrointestinal: Negative.   Genitourinary: Negative.   Musculoskeletal: Negative.   Skin: Negative.   Neurological: Negative.   Endo/Heme/Allergies: Negative.   Psychiatric/Behavioral: Positive for depression (Stailized with medication prior to discharge), hallucinations (Hx. psychosis) and substance abuse (Hx. Cocaine/THC use disorder). Negative for suicidal ideas. The patient has insomnia (Stabilized with medication prior to discharge ). The patient is not nervous/anxious.  All other systems reviewed and are negative.   Blood pressure 129/86, pulse 96, temperature 98.9 F (37.2 C), temperature source Oral, resp. rate 16, height 5\' 11"  (1.803 m), weight 76.7 kg (169 lb).Body mass index is 23.57 kg/m.  See Md's SRA.  Have you used any form of tobacco in the last 30 days? (Cigarettes, Smokeless Tobacco, Cigars, and/or Pipes): Yes  Has this patient used any form of tobacco in the last 30 days? (Cigarettes, Smokeless Tobacco, Cigars, and/or Pipes):Yes, an FDA-approved tobacco cessation medication  was offered at discharge.  Blood Alcohol level:  No results found for: [facility]  Metabolic Disorder Labs:  Lab Results  Component Value Date   HGBA1C 5.3 05/23/2018   MPG 105.41 05/23/2018   MPG 114 07/14/2017   Lab Results  Component Value Date   PROLACTIN 15.5 (H) 05/24/2018   PROLACTIN 16.6 (H) 06/28/2016   Lab Results  Component Value Date   CHOL 134 05/23/2018   TRIG 96 05/23/2018   HDL 50 05/23/2018   CHOLHDL 2.7 05/23/2018   VLDL 19 05/23/2018   LDLCALC 65 05/23/2018   LDLCALC 68 07/14/2017   See Psychiatric Specialty Exam and Suicide Risk Assessment completed by Attending Physician prior to discharge.  Discharge destination:  Home  Is patient on multiple antipsychotic therapies at discharge:  No   Has Patient had three or more failed trials of antipsychotic monotherapy by history:  No  Recommended Plan for Multiple Antipsychotic Therapies: NA  Allergies as of 05/28/2018   No Known Allergies     Medication List    STOP taking these medications   ARIPiprazole 15 MG tablet Commonly known as:  ABILIFY   FLUoxetine 40 MG capsule Commonly known as:  PROZAC   ibuprofen 800 MG tablet Commonly known as:  ADVIL,MOTRIN   lisinopril 10 MG tablet Commonly known as:  PRINIVIL,ZESTRIL   mirtazapine 7.5 MG tablet Commonly known as:  REMERON   prazosin 2 MG capsule Commonly known as:  MINIPRESS     TAKE these medications     Indication  albuterol 108 (90 Base) MCG/ACT inhaler Commonly known as:  PROVENTIL HFA;VENTOLIN HFA Inhale 2 puffs into the lungs every 4 (four) hours as needed for wheezing or shortness of breath.  Indication:  Asthma   escitalopram 5 MG tablet Commonly known as:  LEXAPRO Take 3 tablets (15 mg total) by mouth daily. For depression Start taking on:  05/29/2018  Indication:  Major Depressive Disorder   gabapentin 100 MG capsule Commonly known as:  NEURONTIN Take 2 capsules (200 mg total) by mouth 3 (three) times daily. For  agitation What changed:  additional instructions  Indication:  Agitation   hydrOXYzine 25 MG tablet Commonly known as:  ATARAX/VISTARIL Take 1 tablet (25 mg total) by mouth every 6 (six) hours as needed for anxiety. What changed:    medication strength  how much to take  Indication:  Feeling Anxious   mometasone-formoterol 100-5 MCG/ACT Aero Commonly known as:  DULERA Inhale 2 puffs into the lungs 2 (two) times daily. For shortness of breath What changed:  additional instructions  Indication:  Asthma   nicotine 21 mg/24hr patch Commonly known as:  NICODERM CQ - dosed in mg/24 hours Place 1 patch (21 mg total) onto the skin daily. (May purchase from over the counter): For smoking cessation What changed:  additional instructions  Indication:  Nicotine Addiction   OLANZapine 15 MG tablet Commonly known as:  ZYPREXA Take 1 tablet (15 mg total) by mouth at  bedtime. For mood control  Indication:  Mood control   traZODone 100 MG tablet Commonly known as:  DESYREL Take 1 tablet (100 mg total) by mouth at bedtime as needed for sleep. What changed:    medication strength  how much to take  Indication:  Trouble Sleeping      Follow-up [facility] [employer] [facility]. Go on 05/29/2018.   Why:  Please attend your appt on Tuesday, 05/29/18, at 8:15am. Contact information: [address] [place] [address] [phone]          Follow-up recommendations: Activity:  As tolerated Diet: As recommended by your primary care doctor. Keep all scheduled follow-up appointments as recommended.   Comments: Patient is instructed prior to discharge to: Take all medications as prescribed by his/her mental healthcare provider. Report any adverse effects and or reactions from the medicines to his/her outpatient provider promptly. Patient has been instructed & cautioned: To not engage in alcohol and or illegal drug use while on prescription medicines. In the event of  worsening symptoms, patient is instructed to call the crisis hotline, 911 and or go to the nearest ED for appropriate evaluation and treatment of symptoms. To follow-up with his/her primary care provider for your other medical issues, concerns and or health care needs.   Signed: Armandina StammerAgnes Nwoko, NP, PMHNP, FNP-BC 05/28/2018, 12:26 PM   Patient seen, Suicide Assessment Completed.  Disposition Plan Reviewed

## 2019-06-15 IMAGING — CR DG WRIST COMPLETE 3+V*L*
4 series · 4 of 4 positions shown · non-contrast
Comparison: Plain films of the left wrist 06/16/2017.

CLINICAL DATA: History of fixation of wrist and first metacarpal
fractures suffered in a fall 06/16/2017. Subsequent encounter.

EXAM:
LEFT HAND - COMPLETE 3+ VIEW; LEFT WRIST - COMPLETE 3+ VIEW

[x wrist pa left]
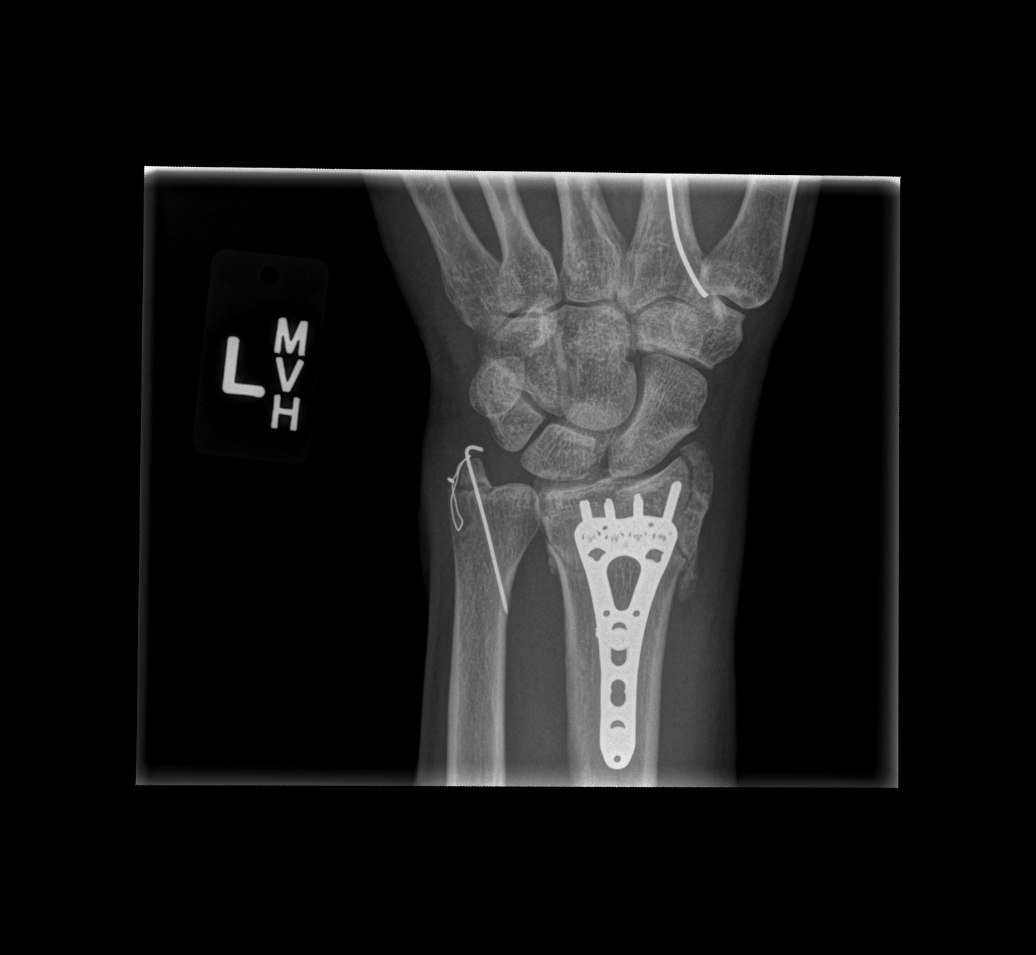

[x wrist obl left]
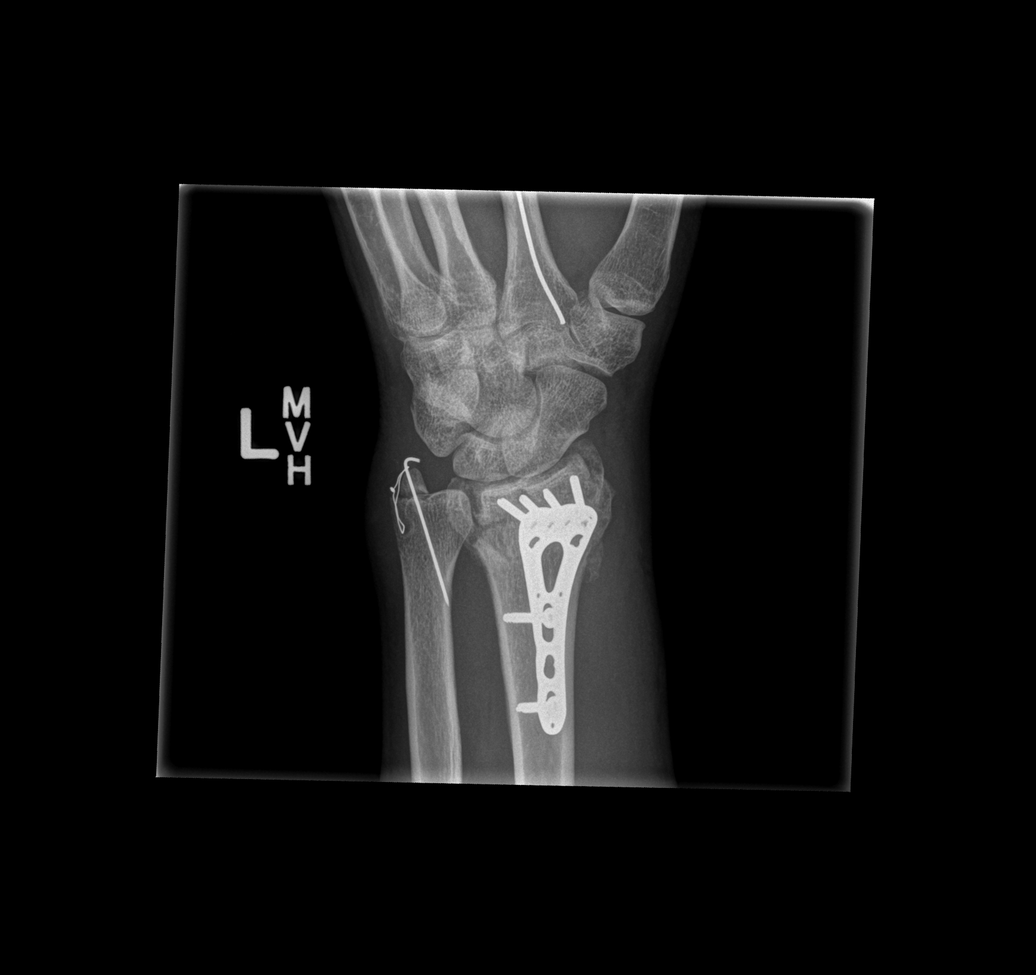

[x wrist lat left]
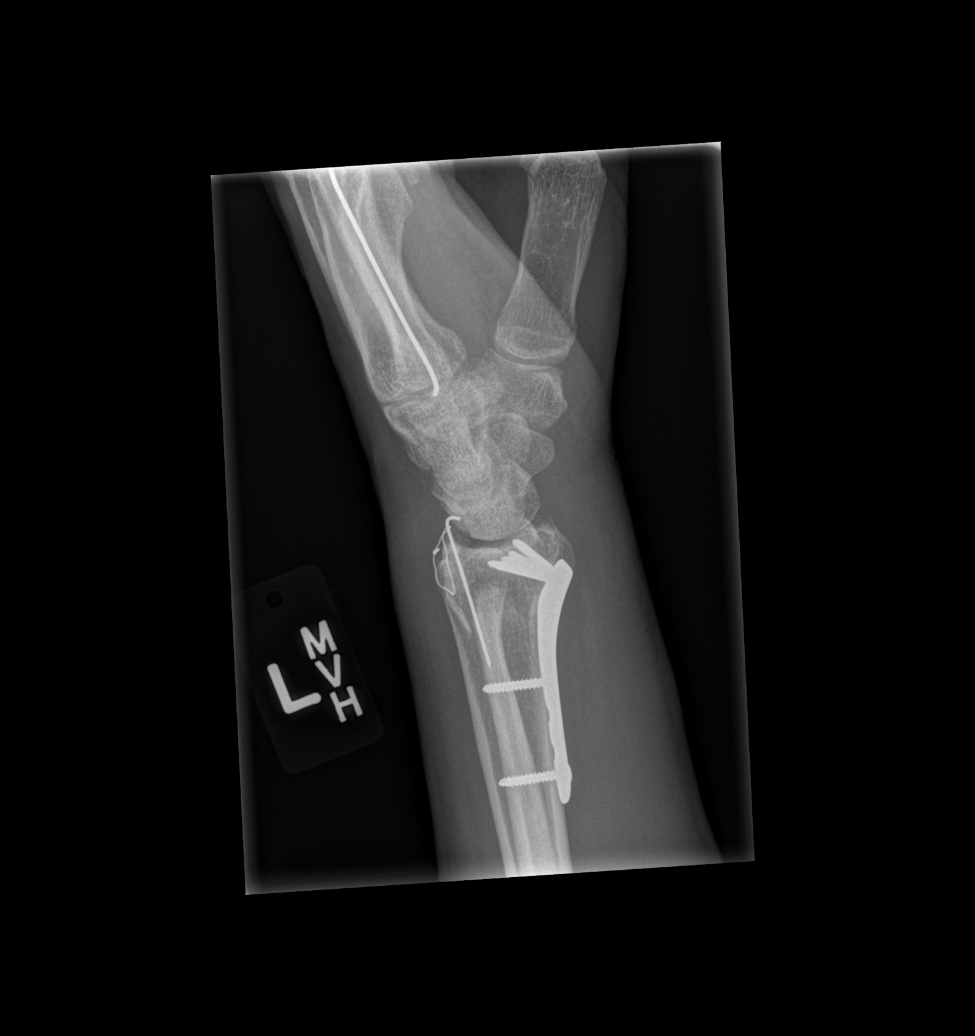

[x wrist navicular view left]
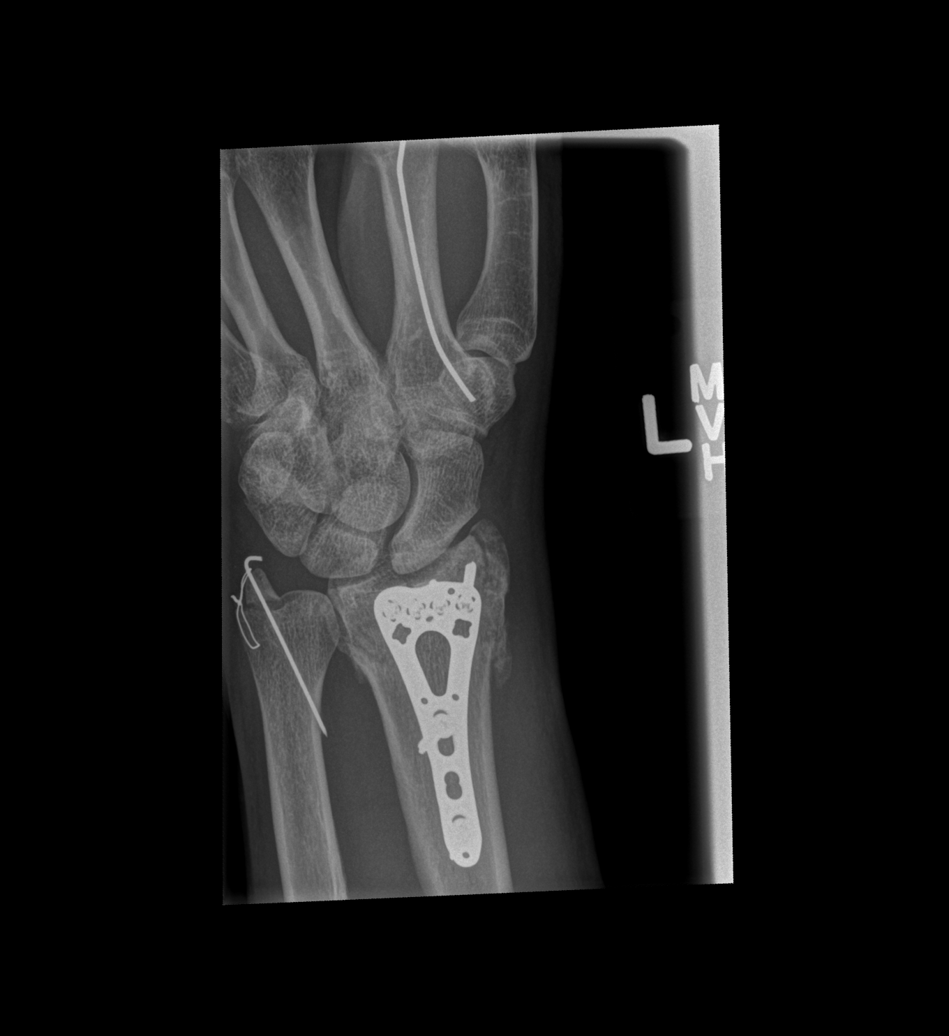

[4 of 4 positions shown; findings below may reference images not displayed]

FINDINGS: Since the prior examination, the patient has undergone fixation of a
second metacarpal fracture with single fixation wire in place. Plate
and screws fixing a distal radius fracture are also identified.
Single pin and cerclage wire fix an ulnar styloid fracture. Hardware
is intact. Fracture lines remain visible. No acute abnormality.
IMPRESSION: Status post fixation of distal radius, ulnar and second metacarpal
fractures. No acute finding.
